# Patient Record
Sex: Female | Born: 1990 | Race: White | Hispanic: No | Marital: Single | Smoking: Never smoker
Health system: Southern US, Community
[De-identification: ages and names within clinical notes are randomized; demographics above are authoritative.]

## PROBLEM LIST (undated history)

## (undated) DIAGNOSIS — F32A Depression, unspecified: Secondary | ICD-10-CM

## (undated) DIAGNOSIS — F909 Attention-deficit hyperactivity disorder, unspecified type: Secondary | ICD-10-CM

## (undated) DIAGNOSIS — F419 Anxiety disorder, unspecified: Secondary | ICD-10-CM

## (undated) DIAGNOSIS — F329 Major depressive disorder, single episode, unspecified: Secondary | ICD-10-CM

## (undated) HISTORY — DX: Attention-deficit hyperactivity disorder, unspecified type: F90.9

## (undated) HISTORY — PX: WISDOM TOOTH EXTRACTION: SHX21

## (undated) HISTORY — DX: Major depressive disorder, single episode, unspecified: F32.9

## (undated) HISTORY — DX: Anxiety disorder, unspecified: F41.9

## (undated) HISTORY — DX: Depression, unspecified: F32.A

---
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
## 2015-05-05 DIAGNOSIS — F419 Anxiety disorder, unspecified: Secondary | ICD-10-CM | POA: Insufficient documentation

## 2015-05-05 DIAGNOSIS — F900 Attention-deficit hyperactivity disorder, predominantly inattentive type: Secondary | ICD-10-CM | POA: Insufficient documentation

## 2015-05-05 DIAGNOSIS — L700 Acne vulgaris: Secondary | ICD-10-CM | POA: Insufficient documentation

## 2015-11-24 ENCOUNTER — Other Ambulatory Visit: Payer: Self-pay | Admitting: Family Medicine

## 2015-11-25 LAB — CMP12+LP+TP+TSH+6AC+CBC/D/PLT
A/G RATIO: 1.9 (ref 1.2–2.2)
ALT: 7 IU/L (ref 0–32)
AST: 13 IU/L (ref 0–40)
Albumin: 4.6 g/dL (ref 3.5–5.5)
Alkaline Phosphatase: 39 IU/L (ref 39–117)
BASOS: 0 %
BUN/Creatinine Ratio: 17 (ref 9–23)
BUN: 11 mg/dL (ref 6–20)
Basophils Absolute: 0 10*3/uL (ref 0.0–0.2)
Bilirubin Total: 0.3 mg/dL (ref 0.0–1.2)
CALCIUM: 9 mg/dL (ref 8.7–10.2)
CHLORIDE: 100 mmol/L (ref 96–106)
CHOL/HDL RATIO: 2.5 ratio (ref 0.0–4.4)
CREATININE: 0.65 mg/dL (ref 0.57–1.00)
Cholesterol, Total: 147 mg/dL (ref 100–199)
EOS (ABSOLUTE): 0.3 10*3/uL (ref 0.0–0.4)
Eos: 6 %
Free Thyroxine Index: 1.7 (ref 1.2–4.9)
GFR calc non Af Amer: 126 mL/min/{1.73_m2} (ref >59)
GFR, EST AFRICAN AMERICAN: 145 mL/min/{1.73_m2} (ref >59)
GGT: 9 IU/L (ref 0–60)
GLUCOSE: 83 mg/dL (ref 65–99)
Globulin, Total: 2.4 g/dL (ref 1.5–4.5)
HDL: 59 mg/dL (ref >39)
HEMOGLOBIN: 12.4 g/dL (ref 11.1–15.9)
Hematocrit: 37.7 % (ref 34.0–46.6)
IMMATURE GRANS (ABS): 0 10*3/uL (ref 0.0–0.1)
Immature Granulocytes: 0 %
Iron: 75 ug/dL (ref 27–159)
LDH: 184 IU/L (ref 119–226)
LDL Calculated: 78 mg/dL (ref 0–99)
LYMPHS: 40 %
Lymphocytes Absolute: 1.9 10*3/uL (ref 0.7–3.1)
MCH: 29.9 pg (ref 26.6–33.0)
MCHC: 32.9 g/dL (ref 31.5–35.7)
MCV: 91 fL (ref 79–97)
MONOCYTES: 7 %
Monocytes Absolute: 0.3 10*3/uL (ref 0.1–0.9)
NEUTROS ABS: 2.3 10*3/uL (ref 1.4–7.0)
Neutrophils: 47 %
PHOSPHORUS: 3.2 mg/dL (ref 2.5–4.5)
POTASSIUM: 4 mmol/L (ref 3.5–5.2)
Platelets: 290 10*3/uL (ref 150–379)
RBC: 4.15 x10E6/uL (ref 3.77–5.28)
RDW: 12.8 % (ref 12.3–15.4)
Sodium: 137 mmol/L (ref 134–144)
T3 Uptake Ratio: 25 % (ref 24–39)
T4 TOTAL: 6.7 ug/dL (ref 4.5–12.0)
TSH: 1.94 u[IU]/mL (ref 0.450–4.500)
Total Protein: 7 g/dL (ref 6.0–8.5)
Triglycerides: 48 mg/dL (ref 0–149)
URIC ACID: 4 mg/dL (ref 2.5–7.1)
VLDL Cholesterol Cal: 10 mg/dL (ref 5–40)
WBC: 4.9 10*3/uL (ref 3.4–10.8)

## 2015-11-25 LAB — HGB A1C W/O EAG: Hgb A1c MFr Bld: 4.9 % (ref 4.8–5.6)

## 2016-05-20 ENCOUNTER — Encounter: Payer: Self-pay | Admitting: Registered Nurse

## 2016-05-20 ENCOUNTER — Ambulatory Visit: Payer: Self-pay | Admitting: Registered Nurse

## 2016-05-20 VITALS — BP 112/76 | HR 94 | Temp 98.7°F

## 2016-05-20 DIAGNOSIS — H6593 Unspecified nonsuppurative otitis media, bilateral: Secondary | ICD-10-CM

## 2016-05-20 DIAGNOSIS — J019 Acute sinusitis, unspecified: Secondary | ICD-10-CM

## 2016-05-20 MED ORDER — FEXOFENADINE HCL 180 MG PO TABS: 180.0000 mg | ORAL_TABLET | Freq: Every day | ORAL | 0 refills | Status: DC

## 2016-05-20 NOTE — Progress Notes (Signed)
Subjective:    Patient ID: Tracey Gibson, female    DOB: [birth year], 26 y.o.   MRN: [id number]  25y/o single caucasian female  with sore throat x2 days. Painful swallowing. Unsure if post nasal drainage. Denies runny nose, itchy watery eyes. +ear discomfort, fullness, muffled sounds intermittantly bilaterally.  Denied ear discharge/ bleeding/sick contacts/fever/chills.  PMHx seasonal allergic rhinitis has used flonase and allegra in the past.  Ran out of flonase.  Still has allegra at home.      Review of Systems  Constitutional: Negative for activity change, appetite change, chills, diaphoresis, fatigue, fever and unexpected weight change.  HENT: Positive for ear pain, hearing loss, postnasal drip, sinus pain, sinus pressure and sore throat. Negative for congestion, dental problem, drooling, ear discharge, facial swelling, mouth sores, nosebleeds, rhinorrhea, sneezing, tinnitus, trouble swallowing and voice change.   Eyes: Negative for photophobia, pain, discharge, redness, itching and visual disturbance.  Respiratory: Negative for cough, choking, chest tightness, shortness of breath, wheezing and stridor.   Cardiovascular: Negative for chest pain, palpitations and leg swelling.  Gastrointestinal: Negative for abdominal distention, abdominal pain, blood in stool, constipation, diarrhea, nausea and vomiting.  Endocrine: Negative for cold intolerance and heat intolerance.  Genitourinary: Negative for difficulty urinating, dysuria and hematuria.  Musculoskeletal: Negative for arthralgias, back pain, gait problem, joint swelling, myalgias, neck pain and neck stiffness.  Skin: Negative for color change, pallor, rash and wound.  Allergic/Immunologic: Positive for environmental allergies. Negative for food allergies.  Neurological: Negative for dizziness, tremors, seizures, syncope, facial asymmetry, speech difficulty, weakness, light-headedness, numbness and headaches.  Hematological: Negative for  adenopathy. Does not bruise/bleed easily.  Psychiatric/Behavioral: Negative for agitation, behavioral problems, confusion and sleep disturbance.       Objective:   Physical Exam  Constitutional: She is oriented to person, place, and time. Vital signs are normal. She appears well-developed and well-nourished. She is active and cooperative.  Non-toxic appearance. She does not have a sickly appearance. She does not appear ill. No distress.  HENT:  Head: Normocephalic and atraumatic.  Right Ear: Hearing, external ear and ear canal normal. A middle ear effusion is present.  Left Ear: Hearing, external ear and ear canal normal. A middle ear effusion is present.  Nose: Mucosal edema and rhinorrhea present. No nose lacerations, sinus tenderness, nasal deformity, septal deviation or nasal septal hematoma. No epistaxis.  No foreign bodies. Right sinus exhibits no maxillary sinus tenderness and no frontal sinus tenderness. Left sinus exhibits no maxillary sinus tenderness and no frontal sinus tenderness.  Mouth/Throat: Uvula is midline and mucous membranes are normal. Mucous membranes are not pale, not dry and not cyanotic. She does not have dentures. No oral lesions. No trismus in the jaw. Normal dentition. No dental abscesses, uvula swelling, lacerations or dental caries. Posterior oropharyngeal edema and posterior oropharyngeal erythema present. No oropharyngeal exudate or tonsillar abscesses.  Bilateral TMs air fluid level clear; bilateral nasal turbinates edema/erythema clear discharge; cobblestoning posterior pharynx; bilateral allergic shiners  Eyes: Conjunctivae, EOM and lids are normal. Pupils are equal, round, and reactive to light. Right eye exhibits no chemosis, no discharge, no exudate and no hordeolum. No foreign body present in the right eye. Left eye exhibits no chemosis, no discharge, no exudate and no hordeolum. No foreign body present in the left eye. Right conjunctiva is not injected. Right  conjunctiva has no hemorrhage. Left conjunctiva is not injected. Left conjunctiva has no hemorrhage. No scleral icterus. Right eye exhibits normal extraocular motion and no nystagmus. Left  eye exhibits normal extraocular motion and no nystagmus. Right pupil is round and reactive. Left pupil is round and reactive. Pupils are equal.  Neck: Trachea normal and normal range of motion. Neck supple. No tracheal tenderness, no spinous process tenderness and no muscular tenderness present. No neck rigidity. No tracheal deviation, no edema, no erythema and normal range of motion present. No thyroid mass and no thyromegaly present.  Cardiovascular: Normal rate, regular rhythm, S1 normal, S2 normal, normal heart sounds and intact distal pulses.  PMI is not displaced.  Exam reveals no gallop and no friction rub.   No murmur heard. Pulmonary/Chest: Effort normal and breath sounds normal. No accessory muscle usage or stridor. No respiratory distress. She has no decreased breath sounds. She has no wheezes. She has no rhonchi. She has no rales. She exhibits no tenderness.  Speaks full sentences without difficulty no cough observed in exam room  Abdominal: Soft. Normal appearance. She exhibits no distension.  Musculoskeletal: Normal range of motion. She exhibits no edema or tenderness.       Right shoulder: Normal.       Left shoulder: Normal.       Right hip: Normal.       Left hip: Normal.       Right knee: Normal.       Left knee: Normal.       Cervical back: Normal.       Right hand: Normal.       Left hand: Normal.  Lymphadenopathy:       Head (right side): No submental, no submandibular, no tonsillar, no preauricular, no posterior auricular and no occipital adenopathy present.       Head (left side): No submental, no submandibular, no tonsillar, no preauricular, no posterior auricular and no occipital adenopathy present.    She has no cervical adenopathy.       Right cervical: No superficial cervical, no  deep cervical and no posterior cervical adenopathy present.      Left cervical: No superficial cervical, no deep cervical and no posterior cervical adenopathy present.  Neurological: She is alert and oriented to person, place, and time. She has normal strength. She is not disoriented. She displays no atrophy and no tremor. No cranial nerve deficit or sensory deficit. She exhibits normal muscle tone. She displays no seizure activity. Coordination and gait normal. GCS eye subscore is 4. GCS verbal subscore is 5. GCS motor subscore is 6.  Skin: Skin is warm, dry and intact. No abrasion, no bruising, no burn, no ecchymosis, no laceration, no lesion, no petechiae and no rash noted. She is not diaphoretic. No cyanosis or erythema. No pallor. Nails show no clubbing.  Psychiatric: She has a normal mood and affect. Her speech is normal and behavior is normal. Judgment and thought content normal. Cognition and memory are normal.  Nursing note and vitals reviewed.         Assessment & Plan:  A-acute rhinosinusitis; acute otitis media effusion; acute seasonal allergic rhinitis pollen  P-Restart flonase 1 spray each nostril BID, saline 2 sprays each nostril q2h prn congestion.  If no improvement with 48 hours of saline, allegra and flonase use follow up for re-evaluation consider oral antibiotics or steroids.  Rx given.  No evidence of systemic bacterial infection, non toxic and well hydrated.  I do not see where any further testing or imaging is necessary at this time.   I will suggest supportive care, rest, good hygiene and encourage the patient to  take adequate fluids.  The patient is to return to clinic or EMERGENCY ROOM if symptoms worsen or change significantly.  Exitcare handout on sinusitis given to patient.  Patient verbalized agreement and understanding of treatment plan and had no further questions at this time.   P2:  Hand washing and cover cough  Supportive treatment.   No evidence of invasive  bacterial infection, non toxic and well hydrated.  I do not see where any further testing or imaging is necessary at this time.   I will suggest supportive care, rest, good hygiene and encourage the patient to take adequate fluids.  The patient is to return to clinic or EMERGENCY ROOM if symptoms worsen or change significantly e.g. ear pain, fever, purulent discharge from ears or bleeding.  Exitcare handout on otitis media with effusion given to patient.  Patient verbalized agreement and understanding of treatment plan.    Restart flonase 1 spray each nostril BID #1 RF0 dispensed from [facility].  Given 1 UD bottle nasal saline from clinic stock.  Has allegra at home and electronic Rx for refill allegra 180mg  po daily #90 RF0 sent to pharmacy of patient choice so she can use flex spending card.  Patient may use normal saline nasal spray as needed.  Consider antihistamine or nasal steroid use.  Avoid triggers if possible.  Shower prior to bedtime if exposed to triggers.  If allergic dust/dust mites recommend mattress/pillow covers/encasements; washing linens, vacuuming, sweeping, dusting weekly.  Call or return to clinic as needed if these symptoms worsen or fail to improve as anticipated.   Exitcare handout on allergic rhinitis given to patient.  Patient verbalized understanding of instructions, agreed with plan of care and had no further questions at this time.  P2:  Avoidance and hand washing.  Usually no specific medical treatment is needed if a virus is causing the sore throat.  The throat most often gets better on its own within 5 to 7 days.  Antibiotic medicine does not cure viral pharyngitis.   For acute pharyngitis caused by bacteria, your healthcare provider will prescribe an antibiotic.  Marland Kitchen Do not smoke.  Marland Kitchen Avoid secondhand smoke and other air pollutants.  . Use a cool mist humidifier to add moisture to the air.  . Get plenty of rest.  . You may want to rest your throat by talking less and eating a  diet that is mostly liquid or soft for a day or two.   Marland Kitchen Nonprescription throat lozenges and mouthwashes should help relieve the soreness.   . Gargling with warm saltwater and drinking warm liquids may help.  (You can make a saltwater solution by adding 1/4 teaspoon of salt to 8 ounces, or 240 mL, of warm water.)  . A nonprescription pain reliever such as aspirin, acetaminophen, or ibuprofen may ease general aches and pains.   FOLLOW UP with clinic provider if no improvements in the next 7-10 days.  Patient verbalized understanding of instructions and agreed with plan of care.

## 2016-05-20 NOTE — Patient Instructions (Signed)
Restart flonase 1 spray each nostril twice a day Nasal saline 2 sprays each nostril every 2 hours while awake as needed for congestion Aggressive nasal spray use in the shower twice a day Shower twice a day Restart allegra 180mg  by mouth daily at home (refill sent in to pharmacy for you to use flex spending account) If worsening follow up for re-evaluation especially fever, ear discharge, daily headache, teeth pain, worsening ear pain   Otitis Media With Effusion, Pediatric Otitis media with effusion (OME) occurs when there is inflammation of the middle ear and fluid in the middle ear space. There are no signs and symptoms of infection. The middle ear space contains air and the bones for hearing. Air in the middle ear space helps to transmit sound to the brain. OME is a common condition in children, and it often occurs after an ear infection. This condition may be present for several weeks or longer after an ear infection. Most cases of this condition get better on their own. What are the causes? OME is caused by a blockage of the eustachian tube in one or both ears. These tubes drain fluid in the ears to the back of the nose (nasopharynx). If the tissue in the tube swells up (edema), the tube closes. This prevents fluid from draining. Blockage can be caused by:  Ear infections.  Colds and other upper respiratory infections.  Allergies.  Irritants, such as tobacco smoke.  Enlarged adenoids. The adenoids are areas of soft tissue located high in the back of the throat, behind the nose and the roof of the mouth. They are part of the body's natural defense (immune) system.  A mass in the nasopharynx.  Damage to the ear caused by pressure changes (barotrauma). What increases the risk? Your child is more likely to develop this condition if:  He or she has repeated ear and sinus infections.  He or she has allergies.  He or she is exposed to tobacco smoke.  He or she attends  daycare.  He or she is not breastfed. What are the signs or symptoms? Symptoms of this condition may not be obvious. Sometimes this condition does not have any symptoms, or symptoms may overlap with those of a cold or upper respiratory tract illness. Symptoms of this condition include:  Temporary hearing loss.  A feeling of fullness in the ear without pain.  Irritability or agitation.  Balance (vestibular) problems. As a result of hearing loss, your child may:  Listen to the TV at a loud volume.  Not respond to questions.  Ask "What?" often when spoken to.  Mistake or confuse one sound or word for another.  Perform poorly at school.  Have a poor attention span.  Become agitated or irritated easily. How is this diagnosed? This condition is diagnosed with an ear exam. Your child's health care provider will look inside your child's ear with an instrument (otoscope) to check for redness, swelling, and fluid. Other tests may be done, including:  A test to check the movement of the eardrum (pneumatic otoscopy). This is done by squeezing a small amount of air into the ear.  A test that changes air pressure in the middle ear to check how well the eardrum moves and to see if the eustachian tube is working (tympanogram).  Hearing test (audiogram). This test involves playing tones at different pitches to see if your child can hear each tone. How is this treated? Treatment for this condition depends on the cause. In  many cases, the fluid goes away on its own. In some cases, your child may need a procedure to create a hole in the eardrum to allow fluid to drain (myringotomy) and to insert small drainage tubes (tympanostomy tubes) into the eardrums. These tubes help to drain fluid and prevent infection. This procedure may be recommended if:  OME does not get better over several months.  Your child has many ear infections within several months.  Your child has noticeable hearing  loss.  Your child has problems with speech and language development. Surgery may also be done to remove the adenoids (adenoidectomy). Follow these instructions at home:  Give over-the-counter and prescription medicines only as told by your child's health care provider.  Keep children away from any tobacco smoke.  Keep all follow-up visits as told by your child's health care provider. This is important. How is this prevented?  Keep your child's vaccinations up to date. Make sure your child gets all recommended vaccinations, including a pneumonia and flu vaccine.  Encourage hand washing. Your child should wash his or her hands often with soap and water. If there is no soap and water, he or she should use hand sanitizer.  Avoid exposing your child to tobacco smoke.  Breastfeed your baby, if possible. Babies who are breastfed as long as possible are less likely to develop this condition. Contact a health care provider if:  Your child's hearing does not get better after 3 months.  Your child's hearing is worse.  Your child has ear pain.  Your child has a fever.  Your child has drainage from the ear.  Your child is dizzy.  Your child has a lump on his or her neck. Get help right away if:  Your child has bleeding from the nose.  Your child cannot move part of her or his face.  Your child has trouble breathing.  Your child cannot smell.  Your child develops severe congestion.  Your child develops weakness.  Your child who is younger than 3 months has a temperature of 100F (38C) or higher. Summary  Otitis media with effusion (OME) occurs when there is inflammation of the middle ear and fluid in the middle ear space.  This condition is caused by blockage of one or both eustachian tubes, which drain fluid in the ears to the back of the nose.  Symptoms of this condition can include temporary hearing loss, a feeling of fullness in the ear, irritability or agitation, and  balance (vestibular) problems. Sometimes, there are no symptoms.  This condition is diagnosed with an ear exam and tests, such as pneumatic otoscopy, tympanogram, and audiogram.  Treatment for this condition depends on the cause. In many cases, the fluid goes away on its own. This information is not intended to replace advice given to you by your health care provider. Make sure you discuss any questions you have with your health care provider. Document Released: 05/08/2003 Document Revised: 01/08/2016 Document Reviewed: 01/08/2016 Elsevier Interactive Patient Education  2017 [employer]  Sinusitis, Adult Sinusitis is soreness and inflammation of your sinuses. Sinuses are hollow spaces in the bones around your face. They are located:  Around your eyes.  In the middle of your forehead.  Behind your nose.  In your cheekbones. Your sinuses and nasal passages are lined with a stringy fluid (mucus). Mucus normally drains out of your sinuses. When your nasal tissues get inflamed or swollen, the mucus can get trapped or blocked so air cannot flow through your  sinuses. This lets bacteria, viruses, and funguses grow, and that leads to infection. Follow these instructions at home: Medicines   Take, use, or apply over-the-counter and prescription medicines only as told by your doctor. These may include nasal sprays.  If you were prescribed an antibiotic medicine, take it as told by your doctor. Do not stop taking the antibiotic even if you start to feel better. Hydrate and Humidify   Drink enough water to keep your pee (urine) clear or pale yellow.  Use a cool mist humidifier to keep the humidity level in your home above 50%.  Breathe in steam for 10-15 minutes, 3-4 times a day or as told by your doctor. You can do this in the bathroom while a hot shower is running.  Try not to spend time in cool or dry air. Rest   Rest as much as possible.  Sleep with your head raised  (elevated).  Make sure to get enough sleep each night. General instructions   Put a warm, moist washcloth on your face 3-4 times a day or as told by your doctor. This will help with discomfort.  Wash your hands often with soap and water. If there is no soap and water, use hand sanitizer.  Do not smoke. Avoid being around people who are smoking (secondhand smoke).  Keep all follow-up visits as told by your doctor. This is important. Contact a doctor if:  You have a fever.  Your symptoms get worse.  Your symptoms do not get better within 10 days. Get help right away if:  You have a very bad headache.  You cannot stop throwing up (vomiting).  You have pain or swelling around your face or eyes.  You have trouble seeing.  You feel confused.  Your neck is stiff.  You have trouble breathing. This information is not intended to replace advice given to you by your health care provider. Make sure you discuss any questions you have with your health care provider. Document Released: 08/04/2007 Document Revised: 10/12/2015 Document Reviewed: 12/11/2014 Elsevier Interactive Patient Education  2017 [employer] Allergic Rhinitis Allergic rhinitis is when the mucous membranes in the nose respond to allergens. Allergens are particles in the air that cause your body to have an allergic reaction. This causes you to release allergic antibodies. Through a chain of events, these eventually cause you to release histamine into the blood stream. Although meant to protect the body, it is this release of histamine that causes your discomfort, such as frequent sneezing, congestion, and an itchy, runny nose. What are the causes? Seasonal allergic rhinitis (hay fever) is caused by pollen allergens that may come from grasses, trees, and weeds. Year-round allergic rhinitis (perennial allergic rhinitis) is caused by allergens such as house dust mites, pet dander, and mold spores. What are the signs or  symptoms?  Nasal stuffiness (congestion).  Itchy, runny nose with sneezing and tearing of the eyes. How is this diagnosed? Your health care provider can help you determine the allergen or allergens that trigger your symptoms. If you and your health care provider are unable to determine the allergen, skin or blood testing may be used. Your health care provider will diagnose your condition after taking your health history and performing a physical exam. Your health care provider may assess you for other related conditions, such as asthma, pink eye, or an ear infection. How is this treated? Allergic rhinitis does not have a cure, but it can be controlled by:  Medicines that block  allergy symptoms. These may include allergy shots, nasal sprays, and oral antihistamines.  Avoiding the allergen. Hay fever may often be treated with antihistamines in pill or nasal spray forms. Antihistamines block the effects of histamine. There are over-the-counter medicines that may help with nasal congestion and swelling around the eyes. Check with your health care provider before taking or giving this medicine. If avoiding the allergen or the medicine prescribed do not work, there are many new medicines your health care provider can prescribe. Stronger medicine may be used if initial measures are ineffective. Desensitizing injections can be used if medicine and avoidance does not work. Desensitization is when a patient is given ongoing shots until the body becomes less sensitive to the allergen. Make sure you follow up with your health care provider if problems continue. Follow these instructions at home: It is not possible to completely avoid allergens, but you can reduce your symptoms by taking steps to limit your exposure to them. It helps to know exactly what you are allergic to so that you can avoid your specific triggers. Contact a health care provider if:  You have a fever.  You develop a cough that does not  stop easily (persistent).  You have shortness of breath.  You start wheezing.  Symptoms interfere with normal daily activities. This information is not intended to replace advice given to you by your health care provider. Make sure you discuss any questions you have with your health care provider. Document Released: 11/10/2000 Document Revised: 10/17/2015 Document Reviewed: 10/23/2012 Elsevier Interactive Patient Education  2017 [employer].

## 2016-08-19 ENCOUNTER — Ambulatory Visit: Payer: Self-pay | Admitting: *Deleted

## 2016-08-19 VITALS — BP 100/77 | HR 84 | Ht 67.5 in | Wt 159.0 lb

## 2016-08-19 DIAGNOSIS — Z0000 Encounter for general adult medical examination without abnormal findings: Secondary | ICD-10-CM

## 2016-08-19 NOTE — Progress Notes (Signed)
[employer] insurance premium discount evaluation: Labs Drawn. Replacements ROI form signed. Tobacco Free Attestation form signed.  Forms placed in paper chart.  

## 2016-08-20 LAB — CMP12+LP+TP+TSH+6AC+CBC/D/PLT
ALT: 9 IU/L (ref 0–32)
AST: 15 IU/L (ref 0–40)
Albumin/Globulin Ratio: 2.2 (ref 1.2–2.2)
Albumin: 4.8 g/dL (ref 3.5–5.5)
Alkaline Phosphatase: 35 IU/L — ABNORMAL LOW (ref 39–117)
BUN / CREAT RATIO: 10 (ref 9–23)
BUN: 7 mg/dL (ref 6–20)
Basophils Absolute: 0 10*3/uL (ref 0.0–0.2)
Basos: 0 %
Bilirubin Total: 0.3 mg/dL (ref 0.0–1.2)
CALCIUM: 9.2 mg/dL (ref 8.7–10.2)
CHLORIDE: 100 mmol/L (ref 96–106)
CHOL/HDL RATIO: 2.3 ratio (ref 0.0–4.4)
CREATININE: 0.73 mg/dL (ref 0.57–1.00)
Cholesterol, Total: 145 mg/dL (ref 100–199)
EOS (ABSOLUTE): 0.4 10*3/uL (ref 0.0–0.4)
EOS: 6 %
Free Thyroxine Index: 2 (ref 1.2–4.9)
GFR, EST AFRICAN AMERICAN: 133 mL/min/{1.73_m2} (ref >59)
GFR, EST NON AFRICAN AMERICAN: 116 mL/min/{1.73_m2} (ref >59)
GGT: 8 IU/L (ref 0–60)
GLUCOSE: 79 mg/dL (ref 65–99)
Globulin, Total: 2.2 g/dL (ref 1.5–4.5)
HDL: 63 mg/dL (ref >39)
HEMOGLOBIN: 12.5 g/dL (ref 11.1–15.9)
Hematocrit: 37.7 % (ref 34.0–46.6)
IRON: 100 ug/dL (ref 27–159)
Immature Grans (Abs): 0 10*3/uL (ref 0.0–0.1)
Immature Granulocytes: 0 %
LDH: 158 IU/L (ref 119–226)
LDL Calculated: 70 mg/dL (ref 0–99)
LYMPHS ABS: 1.9 10*3/uL (ref 0.7–3.1)
Lymphs: 32 %
MCH: 30.6 pg (ref 26.6–33.0)
MCHC: 33.2 g/dL (ref 31.5–35.7)
MCV: 92 fL (ref 79–97)
MONOCYTES: 8 %
Monocytes Absolute: 0.5 10*3/uL (ref 0.1–0.9)
NEUTROS ABS: 3.1 10*3/uL (ref 1.4–7.0)
Neutrophils: 54 %
PHOSPHORUS: 2.9 mg/dL (ref 2.5–4.5)
PLATELETS: 253 10*3/uL (ref 150–379)
POTASSIUM: 4.1 mmol/L (ref 3.5–5.2)
RBC: 4.08 x10E6/uL (ref 3.77–5.28)
RDW: 13.1 % (ref 12.3–15.4)
SODIUM: 137 mmol/L (ref 134–144)
T3 UPTAKE RATIO: 26 % (ref 24–39)
T4 TOTAL: 7.7 ug/dL (ref 4.5–12.0)
TOTAL PROTEIN: 7 g/dL (ref 6.0–8.5)
TSH: 1.86 u[IU]/mL (ref 0.450–4.500)
Triglycerides: 58 mg/dL (ref 0–149)
URIC ACID: 4.3 mg/dL (ref 2.5–7.1)
VLDL Cholesterol Cal: 12 mg/dL (ref 5–40)
WBC: 5.8 10*3/uL (ref 3.4–10.8)

## 2016-08-20 LAB — HGB A1C W/O EAG: HEMOGLOBIN A1C: 4.9 % (ref 4.8–5.6)

## 2016-08-23 NOTE — Progress Notes (Signed)
Results reviewed 08/20/16. Labs unremarkable. Discussed recommended amounts of exercise and fiber. Copy provided to pt. No pcp to route to.

## 2016-11-11 ENCOUNTER — Ambulatory Visit: Payer: Self-pay | Admitting: Registered Nurse

## 2016-11-11 VITALS — BP 114/71 | HR 83 | Temp 97.8°F

## 2016-11-11 DIAGNOSIS — R251 Tremor, unspecified: Secondary | ICD-10-CM

## 2016-11-11 DIAGNOSIS — M25832 Other specified joint disorders, left wrist: Secondary | ICD-10-CM

## 2016-11-11 NOTE — Patient Instructions (Signed)
Ulnar Nerve Contusion Your ulnar nerve extends from your shoulder to the "pinkie side" of your hand. This nerve provides feeling to the pinkie and half of the ring finger. It also controls many hand and forearm muscles that let you grip objects. An ulnar nerve contusion is a bruise of the ulnar nerve. An ulnar nerve contusion can cause a loss of feeling or movement in your hand. It can also affect your ability to use the muscles that you use to grip objects. What are the causes? This condition may be caused by:  A hit to the elbow.  Falling on your elbow.  Shoving your elbow against a hard surface.  What increases the risk? This condition is more likely to develop in people who:  Play contact sports, like football.  Have a disorder that increases the risk of bleeding.  Take blood thinning medicine, such as warfarin.  What are the signs or symptoms? Symptoms of this condition include:  Tingling or numbness in the hand, especially in the pinkie or ring finger.  Weakness while moving the hand from side to side in a waving motion, or while moving your fingers together.  Abnormal claw-like hand position.  How is this diagnosed? This condition may be diagnosed based on your symptoms, a medical history, and a physical exam. You may have tests, such as:  An X-ray. This may be done to check for broken bones.  An electromyogram (EMG). This is done to see how well your nerves are working.  A nerve conduction study. This is done to see how electrical signals pass through your nerves.  An MRI. This is done to find the source of any nerve problems.  How is this treated? This condition usually heals on its own within 6 weeks. Treatment can help to reduce symptoms and it may include:  Wearing a brace or splint at night keep your elbow straight while you sleep.  Taking anti-inflammatory medicine, such as ibuprofen, to reduce pain and swelling around the nerve.  Working with a physical  therapist to ease stiffness in your arm and wrist.  Follow these instructions at home: If you have a brace or splint:  Wear it as told by your health care provider. Remove it only as told by your health care provider.  Loosen the brace or splint if your fingers tingle, become numb, or turn cold and blue.  Keep the brace or splint clean.  If your brace or splint is not waterproof: ? Do not let it get wet. ? Protect it with a watertight covering when you take a bath or a shower. Managing pain, stiffness, and swelling  Take over-the-counter and prescription medicines only as told by your health care provider.  If directed, apply ice to the injured area. ? Put ice in a plastic bag. ? Place a towel between your skin and the bag. ? Leave the ice on for 20 minutes, 2-3 times a day. Activity  Return to your normal activities as told by your health care provider. Ask your health care provider what activities are safe for you.  Avoid activities that take a lot of effort (are strenuous) for as long as told by your health care provider.  Do exercises as told by your health care provider. General instructions  Do not use the injured limb to support your body weight until your health care provider says that you can.  Do not use any tobacco products, including cigarettes, chewing tobacco, or e-cigarettes. Tobacco can delay bone  healing. If you need help quitting, ask your health care provider.  Keep all follow-up visits as told by your health care provider. This is important. How is this prevented?  Warm up and stretch before being active.  Cool down and stretch after being active.  Give your body time to rest between periods of activity.  Make sure to use equipment that fits you.  Be safe and responsible while being active to avoid falls.  Do at least 150 minutes of moderate-intensity exercise each week, such as brisk walking or water aerobics.  Maintain physical fitness,  including: ? Strength. ? Flexibility. ? Cardiovascular fitness. ? Endurance. Contact a health care provider if:  Your pain does not improve or it gets worse.  Your swelling does not improve or it gets worse.  Your grip becomes weaker. Get help right away if:  You have severe pain.  You have severe swelling.  You cannot move your wrist, hand, or elbow.  You cannot feel parts of your hand, wrist, or arm. This information is not intended to replace advice given to you by your health care provider. Make sure you discuss any questions you have with your health care provider. Document Released: 02/15/2005 Document Revised: 10/22/2015 Document Reviewed: 12/26/2014 Elsevier Interactive Patient Education  2018 [employer] Ulnar Nerve Contusion Rehab Ask your health care provider which exercises are safe for you. Do exercises exactly as told by your health care provider and adjust them as directed. It is normal to feel mild stretching, pulling, tightness, or discomfort as you do these exercises, but you should stop right away if you feel sudden pain or your pain gets worse. Do not begin these exercises until told by your health care provider. Stretching and range of motion exercises These exercises warm up your muscles and joints and improve the movement and flexibility of your forearm. These exercises also help to relieve pain, numbness, and tingling. Exercise A: Extensor stretch 1. Stand over a tabletop with your __________ hand resting palm-up on the tabletop and your fingers pointing away from your body. Your arm should be extended and there should be a slight bend in your elbow. 2. Gently press the back of your hand down onto the table by straightening your elbow. You should feel a stretch on the top of your forearm. 3. Hold this position for __________ seconds. 4. Slowly return to the starting position. Repeat __________ times. Complete this stretch __________ times a day. Exercise  B: Flexor stretch  1. Stand over a tabletop with your __________ hand resting palm-down on the tabletop and your fingers pointing away from your body. Your arm should be extended and there should be a slight bend in your elbow. 2. Gently press your fingers and palm down onto the table by straightening your elbow. You should feel a stretch on the inside of your forearm. 3. Hold this position for __________ seconds. 4. Slowly return to the starting position. Repeat __________ times. Complete this stretch __________ times a day. Exercise C: Ulnar nerve glide, hand moving 1. Stand or sit with your left / right elbow bent and your hand at the height of your shoulder. 2. Move your elbow about 6 inches (15 cm) away from your body. 3. Gently and slowly wave your hand back and forth. 4. Repeat this motion for __________ seconds. Repeat __________ times. Complete this exercise __________ times a day. Exercise D: Ulnar nerve glide, elbow moving 1. Stand or sit with your left / right arm straight out  to your side at shoulder height. 2. Bring your fingers up toward the ceiling so your palm faces away from you. 3. Bend your elbow and bring it to your side and bend your wrist so your palm now faces the floor. 4. Slowly go back and forth between doing the step 2 position and the step 3 position. 5. Repeat these motions for __________ seconds. Repeat __________ times. Complete this exercise __________ times a day. Strengthening exercises These exercises build strength and endurance in your forearm. Endurance is the ability to use your muscles for a long time, even after they get tired. Exercise E: Grip  1. Hold one of these items in your __________ hand: a dense sponge, a tennis ball, or a large, rolled sock. 2. Slowly squeeze as hard as you can without increasing any pain. 3. Hold this position for __________ seconds. 4. Slowly release your grip. Repeat __________ times. Complete this exercise __________  times a day. This information is not intended to replace advice given to you by your health care provider. Make sure you discuss any questions you have with your health care provider. Document Released: 02/15/2005 Document Revised: 10/23/2015 Document Reviewed: 12/26/2014 Elsevier Interactive Patient Education  2018 [employer]  Tremor A tremor is trembling or shaking that you cannot control. Most tremors affect the hands or arms. Tremors can also affect the head, vocal cords, face, and other parts of the body. There are many types of tremors. Common types include:  Essential tremor. These usually occur in people over the age of 24. It may run in families and can happen in otherwise healthy people.  Resting tremor. These occur when the muscles are at rest, such as when your hands are resting in your lap. People with Parkinson disease often have resting tremors.  Postural tremor. These occur when you try to hold a pose, such as keeping your hands outstretched.  Kinetic tremor. These occur during purposeful movement, such as trying to touch a finger to your nose.  Task-specific tremor. These may occur when you perform tasks such as handwriting, speaking, or standing.  Psychogenic tremor. These dramatically lessen or disappear when you are distracted. They can happen in people of all ages.  Some types of tremors have no known cause. Tremors can also be a symptom of nervous system problems (neurological disorders) that may occur with aging. Some tremors go away with treatment while others do not. Follow these instructions at home: Watch your tremor for any changes. The following actions may help to lessen any discomfort you are feeling:  Take medicines only as directed by your health care provider.  Limit alcohol intake to no more than 1 drink per day for nonpregnant women and 2 drinks per day for men. One drink equals 12 oz of beer, 5 oz of wine, or 1 oz of hard liquor.  Do not use any  tobacco products, including cigarettes, chewing tobacco, or electronic cigarettes. If you need help quitting, ask your health care provider.  Avoid extreme heat or cold.  Limit the amount of caffeine you consumeas directed by your health care provider.  Try to get 8 hours of sleep each night.  Find ways to manage your stress, such as meditation or yoga.  Keep all follow-up visits as directed by your health care provider. This is important.  Contact a health care provider if:  You start having a tremor after starting a new medicine.  You have tremor with other symptoms such as: ? Numbness. ?  Tingling. ? Pain. ? Weakness.  Your tremor gets worse.  Your tremor interferes with your day-to-day life. This information is not intended to replace advice given to you by your health care provider. Make sure you discuss any questions you have with your health care provider. Document Released: 02/05/2002 Document Revised: 10/19/2015 Document Reviewed: 08/13/2013 Elsevier Interactive Patient Education  2018 [employer].  Cervical Radiculopathy Cervical radiculopathy means that a nerve in the neck is pinched or bruised. This can cause pain or loss of feeling (numbness) that runs from your neck to your arm and fingers. Follow these instructions at home: Managing pain  Take over-the-counter and prescription medicines only as told by your doctor.  If directed, put ice on the injured or painful area. ? Put ice in a plastic bag. ? Place a towel between your skin and the bag. ? Leave the ice on for 20 minutes, 2-3 times per day.  If ice does not help, you can try using heat. Take a warm shower or warm bath, or use a heat pack as told by your doctor.  You may try a gentle neck and shoulder massage. Activity  Rest as needed. Follow instructions from your doctor about any activities to avoid.  Do exercises as told by your doctor or physical therapist. General instructions  If you were  given a soft collar, wear it as told by your doctor.  Use a flat pillow when you sleep.  Keep all follow-up visits as told by your doctor. This is important. Contact a doctor if:  Your condition does not improve with treatment. Get help right away if:  Your pain gets worse and is not controlled with medicine.  You lose feeling or feel weak in your hand, arm, face, or leg.  You have a fever.  You have a stiff neck.  You cannot control when you poop or pee (have incontinence).  You have trouble with walking, balance, or talking. This information is not intended to replace advice given to you by your health care provider. Make sure you discuss any questions you have with your health care provider. Document Released: 02/04/2011 Document Revised: 07/24/2015 Document Reviewed: 04/11/2014 Elsevier Interactive Patient Education  [employer].

## 2016-11-11 NOTE — Progress Notes (Signed)
Subjective:    Patient ID: Tracey Gibson, female    DOB: 1990-09-29, 26 y.o.   MRN: [id number]  26y/o singe caucasian female established Pt reports having an infected sebaceous cyst vs lipoma removed from posterior upper L arm 1 week ago. Was positioned prone on procedure table. During procedure had tingling in L elbow. Now with tingling down lateral length of L arm, worse in the L elbow and 4th and 5th digits. Worsens with increased arm use.  Having difficulty sleeping due to sensations/discomfort.  Stitches intact x 4 left deltoid denied fever/chills/nausea/vomiting/headache/discharge/weakness/dropping items/swelling/rash.  Right hand dominant  Has tried ice and heat and stretching without any relief.  Noticed tremors in hands today at work.  Denied these symptoms occurring in the past.  Still waiting for pathology report from dermatology told results in 7-10 days.  Has been changing bandage over sutures daily and washing area with soap and water in shower.      Review of Systems  Constitutional: Negative for activity change, appetite change, chills, diaphoresis, fatigue and fever.  HENT: Negative for trouble swallowing and voice change.   Eyes: Negative for photophobia and visual disturbance.  Respiratory: Negative for cough, shortness of breath, wheezing and stridor.   Cardiovascular: Negative for chest pain and leg swelling.  Gastrointestinal: Negative for blood in stool, diarrhea, nausea and vomiting.  Endocrine: Negative for cold intolerance and heat intolerance.  Genitourinary: Negative for difficulty urinating, dysuria and hematuria.  Musculoskeletal: Positive for myalgias. Negative for arthralgias, back pain, gait problem, joint swelling, neck pain and neck stiffness.  Skin: Positive for wound. Negative for color change, pallor and rash.  Allergic/Immunologic: Negative for environmental allergies and food allergies.  Neurological: Positive for tremors, weakness and numbness. Negative  for dizziness, seizures, syncope, facial asymmetry, speech difficulty, light-headedness and headaches.  Hematological: Negative for adenopathy. Does not bruise/bleed easily.  Psychiatric/Behavioral: Positive for sleep disturbance. Negative for agitation and confusion. The patient is not nervous/anxious.        Objective:   Physical Exam  Constitutional: She is oriented to person, place, and time. Vital signs are normal. She appears well-developed and well-nourished. She is active and cooperative.  Non-toxic appearance. She does not have a sickly appearance. She does not appear ill. No distress.  HENT:  Head: Normocephalic and atraumatic.  Right Ear: Hearing and external ear normal.  Left Ear: Hearing and external ear normal.  Nose: No mucosal edema, rhinorrhea, nose lacerations, nasal deformity, septal deviation or nasal septal hematoma. No epistaxis.  No foreign bodies.  Mouth/Throat: Uvula is midline and mucous membranes are normal. Mucous membranes are not pale, not dry and not cyanotic. She does not have dentures. No oral lesions. No trismus in the jaw. Normal dentition. No dental abscesses, uvula swelling, lacerations or dental caries. Posterior oropharyngeal edema and posterior oropharyngeal erythema present. No oropharyngeal exudate or tonsillar abscesses.  Cobblestoning posterior pharynx; bilateral allergic shiners  Eyes: Pupils are equal, round, and reactive to light. Conjunctivae, EOM and lids are normal. Right eye exhibits no chemosis, no discharge, no exudate and no hordeolum. No foreign body present in the right eye. Left eye exhibits no chemosis, no discharge, no exudate and no hordeolum. No foreign body present in the left eye. Right conjunctiva is not injected. Right conjunctiva has no hemorrhage. Left conjunctiva is not injected. Left conjunctiva has no hemorrhage. No scleral icterus. Right eye exhibits normal extraocular motion and no nystagmus. Left eye exhibits normal extraocular  motion and no nystagmus. Right pupil is  round and reactive. Left pupil is round and reactive. Pupils are equal.  Neck: Trachea normal, normal range of motion and phonation normal. Neck supple. Muscular tenderness present. No tracheal tenderness and no spinous process tenderness present. No neck rigidity. No tracheal deviation, no edema, no erythema and normal range of motion present. No thyroid mass and no thyromegaly present.    Bilateral trapezius tight and "pulling" sensation with AROM c-spine  Cardiovascular: Normal rate, regular rhythm, S1 normal, S2 normal, normal heart sounds and intact distal pulses.  PMI is not displaced.  Exam reveals no gallop and no friction rub.   No murmur heard. Pulses:      Radial pulses are 2+ on the right side, and 2+ on the left side.  Pulmonary/Chest: Effort normal and breath sounds normal. No accessory muscle usage or stridor. No respiratory distress. She has no decreased breath sounds. She has no wheezes. She has no rhonchi. She has no rales. She exhibits no tenderness.  Abdominal: She exhibits no distension.  Musculoskeletal: Normal range of motion. She exhibits no edema.       Right shoulder: Normal.       Left shoulder: Normal.       Right elbow: Normal.      Left elbow: Normal.       Right wrist: Normal.       Left wrist: Normal.       Right hip: Normal.       Left hip: Normal.       Right knee: Normal.       Left knee: Normal.       Cervical back: She exhibits tenderness, pain and spasm. She exhibits normal range of motion, no bony tenderness, no swelling, no edema, no deformity, no laceration and normal pulse.       Thoracic back: Normal.       Lumbar back: Normal.       Back:       Right upper arm: Normal.       Left upper arm: Normal.       Right forearm: Normal.       Left forearm: Normal.       Arms:      Right hand: Normal.       Left hand: Normal.  Lymphadenopathy:       Head (right side): No submental, no submandibular, no  tonsillar, no preauricular, no posterior auricular and no occipital adenopathy present.       Head (left side): No submental, no submandibular, no tonsillar, no preauricular, no posterior auricular and no occipital adenopathy present.    She has no cervical adenopathy.       Right cervical: No superficial cervical, no deep cervical and no posterior cervical adenopathy present.      Left cervical: No superficial cervical, no deep cervical and no posterior cervical adenopathy present.  Neurological: She is alert and oriented to person, place, and time. She has normal strength. She is not disoriented. She displays tremor. She displays no atrophy and normal reflexes. No cranial nerve deficit or sensory deficit. She exhibits normal muscle tone. She displays no seizure activity. Coordination and gait normal. GCS eye subscore is 4. GCS verbal subscore is 5. GCS motor subscore is 6.  Reflex Scores:      Brachioradialis reflexes are 2+ on the right side and 2+ on the left side.      Patellar reflexes are 2+ on the right side and 2+ on the left side.  Achilles reflexes are 2+ on the right side and 2+ on the left side. Bilateral hand grasp equal 5/5; left 3-5 digits 4+/5 strength opposition resistance; right all digits 5/5; on/off exam table without difficulty; in/out of chair without difficulty; gait sure and stable in hallway; bilateral hand tremors mild equal resolves with AROM most noticeable holding hands elevated in front of body  Skin: Skin is warm and dry. Laceration noted. No abrasion, no bruising, no burn, no ecchymosis, no lesion, no petechiae and no rash noted. She is not diaphoretic. No cyanosis or erythema. No pallor. Nails show no clubbing.     1cm 4 sutures intact left deltoid centrally dry warm pink wound edges well approximated  Psychiatric: She has a normal mood and affect. Her speech is normal and behavior is normal. Judgment and thought content normal. Cognition and memory are normal.   Nursing note and vitals reviewed.   Had removed bandaid to inspect sutures; new telfa 2x4cm bandage reapplied to left deltoid for patient.  No drainage noted on removed bandage.      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  Recently had labs completed 08/19/2016 WNL CBC/Executive panel/HgbA1c.  If  Tremors worsening consider executive panel   Positioning for cyst removal could have caused compression/impingement of ulnar nerve and paresthesias left forearm and 3-5th digits slightly decreased strength fingers 3-5 compared to right.  Right hand dominant.  Unlikely electrolyte imbalance/thyroid disorder/hyper/hypoglycemia with recent normal labs.  Patient anxious for pathology results from dermatology and not sleeping well.  Exitcare handout on impingement ulnar nerve/contusion and rehab exercises, cervical radiculopathy and tremors given to patient.  Instructed patient to contact dermatology office and notify them of tremors/paresthesias.  Follow up with PCM/ER/UC if worsening tremors, dropping items, falls, bowel/bladder incontinence and/or worsening pain.  Trial thermacare cervical applied from clinic stock today.  Biofreeze gel 4% may apply prn topical QID.  Demonstrated cervical  Stretches and discussed ergonomics work station with patient.  Given ice pack chemical for use on break later today.  Patient verbalized understanding information/instructions, agreed with plan of care and had no further questions at this time.

## 2017-06-21 ENCOUNTER — Ambulatory Visit: Payer: Self-pay | Admitting: Registered Nurse

## 2017-06-21 VITALS — BP 122/84 | HR 97 | Temp 97.8°F

## 2017-06-21 DIAGNOSIS — H6983 Other specified disorders of Eustachian tube, bilateral: Secondary | ICD-10-CM

## 2017-06-21 DIAGNOSIS — J310 Chronic rhinitis: Secondary | ICD-10-CM

## 2017-06-21 DIAGNOSIS — G4700 Insomnia, unspecified: Secondary | ICD-10-CM

## 2017-06-21 DIAGNOSIS — H6993 Unspecified Eustachian tube disorder, bilateral: Secondary | ICD-10-CM

## 2017-06-21 NOTE — Progress Notes (Signed)
Subjective:    Patient ID: Tracey Gibson, female    DOB: June 16, 1990, 27 y.o.   MRN: [id number]  26y/o caucasian established female pt c/o low grade fever at home, 99.6-99.8, x2 days. Also c/o inability to sleep x2 days. Describes as difficulty falling asleep and staying asleep. Typically going to sleep at 10pm and awakening 0500.  Showers before bed.  Caffeine only for breakfast.  Tuesdays plays on kickball team.  Other days walks her dog.  Tends to read her phone until she turns out lights. Max straight sleep time reported as 1 hour. Has taken Benadryl, which typically makes her sleepy, without no relief. Also taking Trazodone as Rx'd at bedtime which typically helps her sleep. One month ago, stopped Lexapro, switched to Cymbalta. Full one month wean from Lexapro, then built up to full dose of Cymbalta. No issues with med change until 2 days ago-unsure if related.   Boyfriend, taking CNA class unit exam due this week.  Class ends June.  Cymbalta started 3 weeks ago along with allegra D for allergies.  Has been using flonase doesn't seem to be helping getting sore throat/bad taste in mouth.  + sick contacts at work and seasonal allergies usually spring flare.  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  Will also be taking national certification exam after class ends in June.     Review of Systems  Constitutional: Positive for fatigue and fever. Negative for activity change, appetite change, chills, diaphoresis and unexpected weight change.  HENT: Positive for congestion, postnasal drip, rhinorrhea and sore throat. Negative for dental problem, drooling, ear discharge, ear pain, facial swelling, hearing loss, mouth sores, nosebleeds, sinus pressure, sinus pain, sneezing, tinnitus, trouble swallowing and voice change.   Eyes: Negative for photophobia, pain, discharge, redness, itching and visual disturbance.  Respiratory: Negative for cough, choking, chest tightness, shortness of  breath, wheezing and stridor.   Cardiovascular: Negative for chest pain, palpitations and leg swelling.  Gastrointestinal: Negative for abdominal distention, abdominal pain, blood in stool, constipation, diarrhea, nausea and vomiting.  Endocrine: Negative for cold intolerance and heat intolerance.  Genitourinary: Negative for difficulty urinating, dysuria and hematuria.  Musculoskeletal: Negative for arthralgias, back pain, gait problem, joint swelling, myalgias, neck pain and neck stiffness.  Skin: Negative for color change, pallor, rash and wound.  Allergic/Immunologic: Positive for environmental allergies. Negative for food allergies.  Neurological: Negative for dizziness, tremors, seizures, syncope, facial asymmetry, speech difficulty, weakness, light-headedness, numbness and headaches.  Hematological: Negative for adenopathy. Does not bruise/bleed easily.  Psychiatric/Behavioral: Positive for sleep disturbance. Negative for agitation and confusion.       Objective:   Physical Exam  Constitutional: She is oriented to person, place, and time. Vital signs are normal. She appears well-developed and well-nourished. She is active and cooperative.  Non-toxic appearance. She does not have a sickly appearance. She appears ill. No distress.  HENT:  Head: Normocephalic and atraumatic.  Right Ear: Hearing, external ear and ear canal normal. A middle ear effusion is present.  Left Ear: Hearing, external ear and ear canal normal. A middle ear effusion is present.  Nose: Mucosal edema and rhinorrhea present. No nose lacerations, sinus tenderness, nasal deformity, septal deviation or nasal septal hematoma. No epistaxis.  No foreign bodies. Right sinus exhibits no maxillary sinus tenderness and no frontal sinus tenderness. Left sinus exhibits no maxillary sinus tenderness and no frontal sinus tenderness.  Mouth/Throat: Uvula is midline. Mucous membranes are not pale, dry and  not cyanotic. She does not have  dentures. No oral lesions. No trismus in the jaw. Normal dentition. No dental abscesses, uvula swelling, lacerations or dental caries. Posterior oropharyngeal edema and posterior oropharyngeal erythema present. No oropharyngeal exudate or tonsillar abscesses.  Saliva tacky opaque on tongue; bilateral allergic shiners; bilateral TMs air fluid level clear; bilateral nasal turbinates edema/erythema clear yellow discharge  Eyes: Pupils are equal, round, and reactive to light. Conjunctivae, EOM and lids are normal. Right eye exhibits no chemosis, no discharge, no exudate and no hordeolum. No foreign body present in the right eye. Left eye exhibits no chemosis, no discharge, no exudate and no hordeolum. No foreign body present in the left eye. Right conjunctiva is not injected. Right conjunctiva has no hemorrhage. Left conjunctiva is not injected. Left conjunctiva has no hemorrhage. No scleral icterus. Right eye exhibits normal extraocular motion and no nystagmus. Left eye exhibits normal extraocular motion and no nystagmus. Right pupil is round and reactive. Left pupil is round and reactive. Pupils are equal.  Neck: Trachea normal, normal range of motion and phonation normal. Neck supple. No tracheal tenderness and no muscular tenderness present. No neck rigidity. No tracheal deviation, no edema, no erythema and normal range of motion present. No thyroid mass and no thyromegaly present.  Cardiovascular: Normal rate, regular rhythm, S1 normal, S2 normal, normal heart sounds and intact distal pulses. PMI is not displaced. Exam reveals no gallop, no distant heart sounds and no friction rub.  No murmur heard. Pulmonary/Chest: Effort normal and breath sounds normal. No accessory muscle usage or stridor. No respiratory distress. She has no decreased breath sounds. She has no wheezes. She has no rhonchi. She has no rales. She exhibits no tenderness.  No cough observed in exam room; spoke full sentences without difficulty   Abdominal: Soft. Normal appearance. She exhibits no distension, no fluid wave and no ascites. There is no rigidity and no guarding.  Musculoskeletal: Normal range of motion. She exhibits no edema or tenderness.       Right shoulder: Normal.       Left shoulder: Normal.       Right elbow: Normal.      Left elbow: Normal.       Right hip: Normal.       Left hip: Normal.       Right knee: Normal.       Left knee: Normal.       Cervical back: Normal.       Thoracic back: Normal.       Lumbar back: Normal.       Right hand: Normal.       Left hand: Normal.  Lymphadenopathy:       Head (right side): No submental, no submandibular, no tonsillar, no preauricular, no posterior auricular and no occipital adenopathy present.       Head (left side): No submental, no submandibular, no tonsillar, no preauricular, no posterior auricular and no occipital adenopathy present.    She has no cervical adenopathy.       Right cervical: No superficial cervical, no deep cervical and no posterior cervical adenopathy present.      Left cervical: No superficial cervical, no deep cervical and no posterior cervical adenopathy present.  Neurological: She is alert and oriented to person, place, and time. She has normal strength. She is not disoriented. She displays no atrophy and no tremor. No cranial nerve deficit or sensory deficit. She exhibits normal muscle tone. She displays no seizure activity.  Coordination and gait normal. GCS eye subscore is 4. GCS verbal subscore is 5. GCS motor subscore is 6.  In/out of chair and on/off exam table without difficulty gait sure and steady in hallway  Skin: Skin is warm, dry and intact. No abrasion, no bruising, no burn, no ecchymosis, no laceration, no lesion, no petechiae and no rash noted. She is not diaphoretic. No cyanosis or erythema. No pallor. Nails show no clubbing.  Psychiatric: She has a normal mood and affect. Her speech is normal and behavior is normal. Judgment and  thought content normal. Cognition and memory are normal.  Nursing note and vitals reviewed.         Assessment & Plan:  A-nonallergic rhinitis, eustachian tube dysfunction and insomnia  P-Stress from CNA class unit exam due this week class ends June then national certification test, recently restarted allegra D  and cymbalta initiated 3 weeks ago could all be contributing to insomnia.  Discussed with patient trial plain allegra, she is already taking cymbalta in am and half life 12 hours. Discussed sleep hygiene at length re: regular schedule, avoid screen time 2 hours prior to bed time, dark/quiet/cool bedroom, avoid large meals within 1 hour of sleeping; avoid strenuous exercise within one hour of bedtime; avoid caffeine use after lunch. Continueshower/bath; warm non-alcoholic/caffeinated drink prior to bed. She has already changed phone screen to yellow light night mode also.  Exitcare handout on insomnia given to patient. Follow up with PCM if no improvement in symptoms with above strategies.  Consider appt with Margaretmary Lombard at [facility]. Patient verbalized understanding of instructions, agreed with plan of care and had no further questions at this time.  P2: stress reduction, exercise   Reiterated how to use flonase-patient has been tasting in throat lighter sniff to keep in sinuses/nares.  Patient may use normal saline nasal spray 2 sprays each nostril q2h wa as needed. flonase 1 spray each nostril BID  Patient denied personal or family history of ENT cancer.  OTC antihistamine of choice allegra 180mg  po daily.  Sudafed (D) only when rhinitis not controlled with flonase, saline, shower before bedtime, limiting outside time during high pollen counts and allegra generic.  Avoid triggers if possible.  Shower prior to bedtime if exposed to triggers.  If allergic dust/dust mites recommend mattress/pillow covers/encasements; washing linens, vacuuming, sweeping, dusting weekly.  Call or  return to clinic as needed if these symptoms worsen or fail to improve as anticipated.   Exitcare handout on nonallergic rhinitis and sinus rinse given to patient.  Patient verbalized understanding of instructions, agreed with plan of care and had no further questions at this time.  P2:  Avoidance and hand washing.  Supportive treatment.   No evidence of invasive bacterial infection, non toxic and well hydrated.  This is most likely self limiting viral infection.  I do not see where any further testing or imaging is necessary at this time.   I will suggest supportive care, rest, good hygiene and encourage the patient to take adequate fluids.  The patient is to return to clinic or EMERGENCY ROOM if symptoms worsen or change significantly e.g. ear pain, fever, purulent discharge from ears or bleeding.  Exitcare handout on eustachian tube dysfunction given to patient.  Patient verbalized agreement and understanding of treatment plan.

## 2017-06-21 NOTE — Patient Instructions (Addendum)
Insomnia Insomnia is a sleep disorder that makes it difficult to fall asleep or to stay asleep. Insomnia can cause tiredness (fatigue), low energy, difficulty concentrating, mood swings, and poor performance at work or school. There are three different ways to classify insomnia:  Difficulty falling asleep.  Difficulty staying asleep.  Waking up too early in the morning.  Any type of insomnia can be long-term (chronic) or short-term (acute). Both are common. Short-term insomnia usually lasts for three months or less. Chronic insomnia occurs at least three times a week for longer than three months. What are the causes? Insomnia may be caused by another condition, situation, or substance, such as:  Anxiety.  Certain medicines.  Gastroesophageal reflux disease (GERD) or other gastrointestinal conditions.  Asthma or other breathing conditions.  Restless legs syndrome, sleep apnea, or other sleep disorders.  Chronic pain.  Menopause. This may include hot flashes.  Stroke.  Abuse of alcohol, tobacco, or illegal drugs.  Depression.  Caffeine.  Neurological disorders, such as Alzheimer disease.  An overactive thyroid (hyperthyroidism).  The cause of insomnia may not be known. What increases the risk? Risk factors for insomnia include:  Gender. Women are more commonly affected than men.  Age. Insomnia is more common as you get older.  Stress. This may involve your professional or personal life.  Income. Insomnia is more common in people with lower income.  Lack of exercise.  Irregular work schedule or night shifts.  Traveling between different time zones.  What are the signs or symptoms? If you have insomnia, trouble falling asleep or trouble staying asleep is the main symptom. This may lead to other symptoms, such as:  Feeling fatigued.  Feeling nervous about going to sleep.  Not feeling rested in the morning.  Having trouble concentrating.  Feeling  irritable, anxious, or depressed.  How is this treated? Treatment for insomnia depends on the cause. If your insomnia is caused by an underlying condition, treatment will focus on addressing the condition. Treatment may also include:  Medicines to help you sleep.  Counseling or therapy.  Lifestyle adjustments.  Follow these instructions at home:  Take medicines only as directed by your health care provider.  Keep regular sleeping and waking hours. Avoid naps.  Keep a sleep diary to help you and your health care provider figure out what could be causing your insomnia. Include: ? When you sleep. ? When you wake up during the night. ? How well you sleep. ? How rested you feel the next day. ? Any side effects of medicines you are taking. ? What you eat and drink.  Make your bedroom a comfortable place where it is easy to fall asleep: ? Put up shades or special blackout curtains to block light from outside. ? Use a white noise machine to block noise. ? Keep the temperature cool.  Exercise regularly as directed by your health care provider. Avoid exercising right before bedtime.  Use relaxation techniques to manage stress. Ask your health care provider to suggest some techniques that may work well for you. These may include: ? Breathing exercises. ? Routines to release muscle tension. ? Visualizing peaceful scenes.  Cut back on alcohol, caffeinated beverages, and cigarettes, especially close to bedtime. These can disrupt your sleep.  Do not overeat or eat spicy foods right before bedtime. This can lead to digestive discomfort that can make it hard for you to sleep.  Limit screen use before bedtime. This includes: ? Watching TV. ? Using your smartphone, tablet, and   computer.  Stick to a routine. This can help you fall asleep faster. Try to do a quiet activity, brush your teeth, and go to bed at the same time each night.  Get out of bed if you are still awake after 15 minutes  of trying to sleep. Keep the lights down, but try reading or doing a quiet activity. When you feel sleepy, go back to bed.  Make sure that you drive carefully. Avoid driving if you feel very sleepy.  Keep all follow-up appointments as directed by your health care provider. This is important. Contact a health care provider if:  You are tired throughout the day or have trouble in your daily routine due to sleepiness.  You continue to have sleep problems or your sleep problems get worse. Get help right away if:  You have serious thoughts about hurting yourself or someone else. This information is not intended to replace advice given to you by your health care provider. Make sure you discuss any questions you have with your health care provider. Document Released: 02/13/2000 Document Revised: 07/18/2015 Document Reviewed: 11/16/2013 Elsevier Interactive Patient Education  2018 [employer]. Allergic Rhinitis, Adult Allergic rhinitis is an allergic reaction that affects the mucous membrane inside the nose. It causes sneezing, a runny or stuffy nose, and the feeling of mucus going down the back of the throat (postnasal drip). Allergic rhinitis can be mild to severe. There are two types of allergic rhinitis:  Seasonal. This type is also called hay fever. It happens only during certain seasons.  Perennial. This type can happen at any time of the year.  What are the causes? This condition happens when the body's defense system (immune system) responds to certain harmless substances called allergens as though they were germs.  Seasonal allergic rhinitis is triggered by pollen, which can come from grasses, trees, and weeds. Perennial allergic rhinitis may be caused by:  House dust mites.  Pet dander.  Mold spores.  What are the signs or symptoms? Symptoms of this condition include:  Sneezing.  Runny or stuffy nose (nasal congestion).  Postnasal drip.  Itchy nose.  Tearing of the  eyes.  Trouble sleeping.  Daytime sleepiness.  How is this diagnosed? This condition may be diagnosed based on:  Your medical history.  A physical exam.  Tests to check for related conditions, such as: ? Asthma. ? Pink eye. ? Ear infection. ? Upper respiratory infection.  Tests to find out which allergens trigger your symptoms. These may include skin or blood tests.  How is this treated? There is no cure for this condition, but treatment can help control symptoms. Treatment may include:  Taking medicines that block allergy symptoms, such as antihistamines. Medicine may be given as a shot, nasal spray, or pill.  Avoiding the allergen.  Desensitization. This treatment involves getting ongoing shots until your body becomes less sensitive to the allergen. This treatment may be done if other treatments do not help.  If taking medicine and avoiding the allergen does not work, new, stronger medicines may be prescribed.  Follow these instructions at home:  Find out what you are allergic to. Common allergens include smoke, dust, and pollen.  Avoid the things you are allergic to. These are some things you can do to help avoid allergens: ? Replace carpet with wood, tile, or vinyl flooring. Carpet can trap dander and dust. ? Do not smoke. Do not allow smoking in your home. ? Change your heating and air conditioning filter at  least once a month. ? During allergy season:  Keep windows closed as much as possible.  Plan outdoor activities when pollen counts are lowest. This is usually during the evening hours.  When coming indoors, change clothing and shower before sitting on furniture or bedding.  Take over-the-counter and prescription medicines only as told by your health care provider.  Keep all follow-up visits as told by your health care provider. This is important. Contact a health care provider if:  You have a fever.  You develop a persistent cough.  You make whistling  sounds when you breathe (you wheeze).  Your symptoms interfere with your normal daily activities. Get help right away if:  You have shortness of breath. Summary  This condition can be managed by taking medicines as directed and avoiding allergens.  Contact your health care provider if you develop a persistent cough or fever.  During allergy season, keep windows closed as much as possible. This information is not intended to replace advice given to you by your health care provider. Make sure you discuss any questions you have with your health care provider. Document Released: 11/10/2000 Document Revised: 03/25/2016 Document Reviewed: 03/25/2016 Elsevier Interactive Patient Education  2018 [employer]. Sinus Rinse What is a sinus rinse? A sinus rinse is a simple home treatment that is used to rinse your sinuses with a sterile mixture of salt and water (saline solution). Sinuses are air-filled spaces in your skull behind the bones of your face and forehead that open into your nasal cavity. You will use the following:  Saline solution.  Neti pot or spray bottle. This releases the saline solution into your nose and through your sinuses. Neti pots and spray bottles can be purchased at [employer], a health food store, or online.  When would I do a sinus rinse? A sinus rinse can help to clear mucus, dirt, dust, or pollen from the nasal cavity. You may do a sinus rinse when you have a cold, a virus, nasal allergy symptoms, a sinus infection, or stuffiness in the nose or sinuses. If you are considering a sinus rinse:  Ask your child's health care provider before performing a sinus rinse on your child.  Do not do a sinus rinse if you have had ear or nasal surgery, ear infection, or blocked ears.  How do I do a sinus rinse?  Wash your hands.  Disinfect your device according to the directions provided and then dry it.  Use the solution that comes with your device or one that is  sold separately in stores. Follow the mixing directions on the package.  Fill your device with the amount of saline solution as directed by the device instructions.  Stand over a sink and tilt your head sideways over the sink.  Place the spout of the device in your upper nostril (the one closer to the ceiling).  Gently pour or squeeze the saline solution into the nasal cavity. The liquid should drain to the lower nostril if you are not overly congested.  Gently blow your nose. Blowing too hard may cause ear pain.  Repeat in the other nostril.  Clean and rinse your device with clean water and then air-dry it. Are there risks of a sinus rinse? Sinus rinse is generally very safe and effective. However, there are a few risks, which include:  A burning sensation in the sinuses. This may happen if you do not make the saline solution as directed. Make sure to follow all directions  when making the saline solution.  Infection from contaminated water. This is rare, but possible.  Nasal irritation.  This information is not intended to replace advice given to you by your health care provider. Make sure you discuss any questions you have with your health care provider. Document Released: 09/12/2013 Document Revised: 01/13/2016 Document Reviewed: 07/03/2013 Elsevier Interactive Patient Education  2017 [employer] Nonallergic Rhinitis  1. Nonallergic rhinitis is a term used by allergist to describe inflammation in the nose that is not due to an allergic source (i.e., pollens, mold, animal dander or dust mites). 2. Nonallergic rhinitis can mimic many of the symptoms caused by allergies.  This includes a runny nose ("rhinorrhea"), sneezing, congestion, ear fullness and post nasal drip. 3. These symptoms usually occur year round but can be made worse by many environmental factors including weather change, irritants such as cigarette smoke, fumes, perfumes, detergents and many others. These are not  allergens, but are irritants, and do not cause the formation of antibodies like true allergens.  For this reason most people with nonallergic rhinitis have negative skin tests. 4. Unfortunately, we have a poor understanding of what causes nonallergic rhinitis but certain factors such as deviated septum, sinusitis and nasal polyps may contribute to the symptoms.  Due to the poor understanding of what causes nonallergic rhinitis it cannot be cured and our treatment is only symptomatic to control symptoms. 5. Important factors in the successful treatment of nonallergic rhinitis include avoidance of the irritants that cause symptoms; for example, people who continue to smoke may never improve.  Continuous therapy works better than intermittent.  This may mean taking medications once daily or 3-4 times a day. Helpful treatments include nasal saline lavage, nasal ipratropium (Atrovent), nasal steroids, and decongestants.  Pure antihistamines don't seem to work very well.  Immunotherapy (allergy shots) has no role in the treatment of nonallergic rhinitis.  Several medications may have to be tried before a good combination is found for you.  These medications, in general, are safe for long term use.  Tolerance may develop to the therapeutic effects of these medications: Frequently changing between several helpful medications can prevent this should it occur. Eustachian Tube Dysfunction The eustachian tube connects the middle ear to the back of the nose. It regulates air pressure in the middle ear by allowing air to move between the ear and nose. It also helps to drain fluid from the middle ear space. When the eustachian tube does not function properly, air pressure, fluid, or both can build up in the middle ear. Eustachian tube dysfunction can affect one or both ears. What are the causes? This condition happens when the eustachian tube becomes blocked or cannot open normally. This may result from: Ear  infections. Colds and other upper respiratory infections. Allergies. Irritation, such as from cigarette smoke or acid from the stomach coming up into the esophagus (gastroesophageal reflux). Sudden changes in air pressure, such as from descending in an airplane. Abnormal growths in the nose or throat, such as nasal polyps, tumors, or enlarged tissue at the back of the throat (adenoids).  What increases the risk? This condition may be more likely to develop in people who smoke and people who are overweight. Eustachian tube dysfunction may also be more likely to develop in children, especially children who have: Certain birth defects of the mouth, such as cleft palate. Large tonsils and adenoids.  What are the signs or symptoms? Symptoms of this condition may include: A feeling of fullness in  the ear. Ear pain. Clicking or popping noises in the ear. Ringing in the ear. Hearing loss. Loss of balance.  Symptoms may get worse when the air pressure around you changes, such as when you travel to an area of high elevation or fly on an airplane. How is this diagnosed? This condition may be diagnosed based on: Your symptoms. A physical exam of your ear, nose, and throat. Tests, such as those that measure: The movement of your eardrum (tympanogram). Your hearing (audiometry).  How is this treated? Treatment depends on the cause and severity of your condition. If your symptoms are mild, you may be able to relieve your symptoms by moving air into ("popping") your ears. If you have symptoms of fluid in your ears, treatment may include: Decongestants. Antihistamines. Nasal sprays or ear drops that contain medicines that reduce swelling (steroids).  In some cases, you may need to have a procedure to drain the fluid in your eardrum (myringotomy). In this procedure, a small tube is placed in the eardrum to: Drain the fluid. Restore the air in the middle ear space.  Follow these instructions at  home: Take over-the-counter and prescription medicines only as told by your health care provider. Use techniques to help pop your ears as recommended by your health care provider. These may include: Chewing gum. Yawning. Frequent, forceful swallowing. Closing your mouth, holding your nose closed, and gently blowing as if you are trying to blow air out of your nose. Do not do any of the following until your health care provider approves: Travel to high altitudes. Fly in airplanes. Work in a Estate agent or room. Scuba dive. Keep your ears dry. Dry your ears completely after showering or bathing. Do not smoke. Keep all follow-up visits as told by your health care provider. This is important. Contact a health care provider if: Your symptoms do not go away after treatment. Your symptoms come back after treatment. You are unable to pop your ears. You have: A fever. Pain in your ear. Pain in your head or neck. Fluid draining from your ear. Your hearing suddenly changes. You become very dizzy. You lose your balance. This information is not intended to replace advice given to you by your health care provider. Make sure you discuss any questions you have with your health care provider. Document Released: 03/14/2015 Document Revised: 07/24/2015 Document Reviewed: 03/06/2014 Elsevier Interactive Patient Education  [employer]. 6.

## 2017-07-12 ENCOUNTER — Ambulatory Visit: Payer: Self-pay | Admitting: Registered Nurse

## 2017-07-12 VITALS — BP 112/76 | HR 100 | Temp 97.7°F

## 2017-07-12 DIAGNOSIS — B9789 Other viral agents as the cause of diseases classified elsewhere: Secondary | ICD-10-CM

## 2017-07-12 DIAGNOSIS — H6692 Otitis media, unspecified, left ear: Secondary | ICD-10-CM

## 2017-07-12 DIAGNOSIS — J069 Acute upper respiratory infection, unspecified: Secondary | ICD-10-CM

## 2017-07-12 MED ORDER — AMOXICILLIN-POT CLAVULANATE 875-125 MG PO TABS: 1.0000 | ORAL_TABLET | Freq: Two times a day (BID) | ORAL | 0 refills | Status: AC

## 2017-07-12 NOTE — Progress Notes (Signed)
Subjective:    Patient ID: Tracey Gibson, female    DOB: 09/22/1990, 27 y.o.   MRN: [id number]  26y/o Caucasian single established female pt c/o body aches, productive cough, nasal and chest congestion, L otalgia x4 days, progressively worsening. Using Zrytec-D, Mucinex Cold&Flu. Last seen 06/21/17 for viral URI/insomnia/seasonal allergies.  Chest sore from coughing.  Sat on couch 2 days last week didn't work due to muscle aches/fatigue.  Drinking tea with honey.  Had stopped flonase because she thought taking too much medicine.  Plenty of nasal saline at home.  Feeling better today except left ear and wondering about her cough still not slowing down.  Denied wheezing/SOB/hemoptysis.     Review of Systems  Constitutional: Positive for fatigue. Negative for activity change, appetite change, chills, diaphoresis, fever and unexpected weight change.  HENT: Positive for congestion, ear pain, postnasal drip and rhinorrhea. Negative for dental problem, drooling, ear discharge, facial swelling, hearing loss, mouth sores, nosebleeds, sinus pressure, sinus pain, sneezing, sore throat, tinnitus, trouble swallowing and voice change.   Eyes: Negative for photophobia, pain, discharge, redness, itching and visual disturbance.  Respiratory: Positive for cough. Negative for choking, chest tightness, shortness of breath, wheezing and stridor.   Cardiovascular: Negative for chest pain, palpitations and leg swelling.  Gastrointestinal: Negative for abdominal distention, abdominal pain, blood in stool, constipation, diarrhea, nausea and vomiting.  Endocrine: Negative for cold intolerance and heat intolerance.  Genitourinary: Negative for difficulty urinating, dysuria and hematuria.  Musculoskeletal: Positive for myalgias. Negative for arthralgias, back pain, gait problem, joint swelling, neck pain and neck stiffness.  Skin: Negative for color change, pallor, rash and wound.  Allergic/Immunologic: Positive for  environmental allergies. Negative for food allergies.  Neurological: Negative for dizziness, tremors, seizures, syncope, facial asymmetry, speech difficulty, weakness, light-headedness, numbness and headaches.  Hematological: Negative for adenopathy. Does not bruise/bleed easily.  Psychiatric/Behavioral: Negative for agitation, confusion and sleep disturbance.       Objective:   Physical Exam  Constitutional: She is oriented to person, place, and time. Vital signs are normal. She appears well-developed and well-nourished. She is active and cooperative.  Non-toxic appearance. She does not have a sickly appearance. She appears ill. No distress.  HENT:  Head: Normocephalic and atraumatic.  Right Ear: Hearing, external ear and ear canal normal. A middle ear effusion is present.  Left Ear: Hearing, external ear and ear canal normal. Tympanic membrane is erythematous and bulging. A middle ear effusion is present.  Nose: Mucosal edema and rhinorrhea present. No nose lacerations, sinus tenderness, nasal deformity, septal deviation or nasal septal hematoma. No epistaxis.  No foreign bodies. Right sinus exhibits no maxillary sinus tenderness and no frontal sinus tenderness. Left sinus exhibits no maxillary sinus tenderness and no frontal sinus tenderness.  Mouth/Throat: Uvula is midline and mucous membranes are normal. Mucous membranes are not pale, not dry and not cyanotic. She does not have dentures. No oral lesions. No trismus in the jaw. Normal dentition. No dental abscesses, uvula swelling, lacerations or dental caries. Posterior oropharyngeal edema and posterior oropharyngeal erythema present. No oropharyngeal exudate or tonsillar abscesses.  Bilateral nasal turbinates edema erythema clear discharge; cobblestoning posterior pharynx; bilateral TMs air fluid level centrally slight opacity and 12 oclock left greater than right erythema bulging; bilateral allergic shiners; frequent nose clearing  Eyes:  Pupils are equal, round, and reactive to light. Conjunctivae, EOM and lids are normal. Right eye exhibits no chemosis, no discharge, no exudate and no hordeolum. No foreign body present in the  right eye. Left eye exhibits no chemosis, no discharge, no exudate and no hordeolum. No foreign body present in the left eye. Right conjunctiva is not injected. Right conjunctiva has no hemorrhage. Left conjunctiva is not injected. Left conjunctiva has no hemorrhage. No scleral icterus. Right eye exhibits normal extraocular motion and no nystagmus. Left eye exhibits normal extraocular motion and no nystagmus. Right pupil is round and reactive. Left pupil is round and reactive. Pupils are equal.  Neck: Trachea normal, normal range of motion and phonation normal. Neck supple. No tracheal tenderness, no spinous process tenderness and no muscular tenderness present. No neck rigidity. No tracheal deviation, no edema, no erythema and normal range of motion present. No thyroid mass and no thyromegaly present.  Cardiovascular: Normal rate, regular rhythm, S1 normal, S2 normal, normal heart sounds and intact distal pulses. PMI is not displaced. Exam reveals no gallop, no distant heart sounds and no friction rub.  No murmur heard. Pulmonary/Chest: Effort normal and breath sounds normal. No accessory muscle usage or stridor. No respiratory distress. She has no decreased breath sounds. She has no wheezes. She has no rhonchi. She has no rales. She exhibits no tenderness.  Spoke full sentences without difficulty; cough not observed in exam room  Abdominal: Soft. Normal appearance. She exhibits no distension and no fluid wave. There is no rigidity and no guarding.  Musculoskeletal: Normal range of motion. She exhibits no edema, tenderness or deformity.       Right shoulder: Normal.       Left shoulder: Normal.       Right elbow: Normal.      Left elbow: Normal.       Right hip: Normal.       Left hip: Normal.       Right knee:  Normal.       Left knee: Normal.       Cervical back: Normal.       Thoracic back: She exhibits pain. She exhibits normal range of motion, no tenderness, no bony tenderness, no swelling, no edema, no deformity, no laceration, no spasm and normal pulse.       Lumbar back: She exhibits pain. She exhibits normal range of motion, no tenderness, no swelling, no edema, no deformity, no laceration, no spasm and normal pulse.       Back:       Right hand: Normal.       Left hand: Normal.  Lymphadenopathy:       Head (right side): No submental, no submandibular, no tonsillar, no preauricular, no posterior auricular and no occipital adenopathy present.       Head (left side): No submental, no submandibular, no tonsillar, no preauricular, no posterior auricular and no occipital adenopathy present.    She has no cervical adenopathy.       Right cervical: No superficial cervical, no deep cervical and no posterior cervical adenopathy present.      Left cervical: No superficial cervical, no deep cervical and no posterior cervical adenopathy present.  Neurological: She is alert and oriented to person, place, and time. She has normal strength. She is not disoriented. She displays no atrophy and no tremor. No cranial nerve deficit or sensory deficit. She exhibits normal muscle tone. She displays no seizure activity. Coordination and gait normal. GCS eye subscore is 4. GCS verbal subscore is 5. GCS motor subscore is 6.  In/ouf of chair; on/off exam table without difficulty; gait sure and steady in hallway  Skin: Skin is  warm, dry and intact. Capillary refill takes less than 2 seconds. No abrasion, no bruising, no burn, no ecchymosis, no laceration, no lesion, no petechiae and no rash noted. She is not diaphoretic. No cyanosis or erythema. No pallor. Nails show no clubbing.  Psychiatric: She has a normal mood and affect. Her speech is normal and behavior is normal. Judgment and thought content normal. She is not  actively hallucinating. Cognition and memory are normal. She is attentive.  Nursing note and vitals reviewed.         Assessment & Plan:  A-left acute otitis media, viral URI with cough  P-augmentin  po BID x 10 days #20 RF0 dispensed from [facility].  Uses boric acid if she gets vaginal yeast after antibiotics vaginal cream and diflucan don't work for her.  Supportive treatment.   No evidence of invasive bacterial infection, non toxic and well hydrated.  This is most likely self limiting viral infection.  I do not see where any further testing or imaging is necessary at this time.   I will suggest supportive care, rest, good hygiene and encourage the patient to take adequate fluids.  The patient is to return to clinic or EMERGENCY ROOM if symptoms worsen or change significantly e.g. ear pain, fever, purulent discharge from ears or bleeding.  Exitcare handout on otitis media  given to patient.  Patient verbalized agreement and understanding of treatment plan.    Discussed viral mucous typically clear to yellow to green then done.  Bacterial typically more opaque brown/tan/rose.  Stop mucinex, restart flonase 1 spray each nostril BID at home.  Continue zyrtec D po daily.  Honey with lemon.  Reiterated how to use flonase-patient has been tasting in throat lighter sniff to keep in sinuses/nares.  Patient may use normal saline nasal spray 2 sprays each nostril q2h wa as needed. flonase 1 spray each nostril BID  Patient denied personal or family history of ENT cancer.  OTC antihistamine of choice daily.  Sudafed (D) only when rhinitis not controlled with flonase, saline, shower before bedtime, limiting outside time during high pollen counts and allegra generic.  Avoid triggers if possible.  Shower prior to bedtime if exposed to triggers.  If allergic dust/dust mites recommend mattress/pillow covers/encasements; washing linens, vacuuming, sweeping, dusting weekly.  Call or return to clinic as needed if  these symptoms worsen or fail to improve as anticipated.   Exitcare handout on nonallergic rhinitis and sinus rinse given to patient.  Patient verbalized understanding of instructions, agreed with plan of care and had no further questions at this time.  P2:  Avoidance and hand washing.

## 2017-07-12 NOTE — Patient Instructions (Signed)
Viral Respiratory Infection A respiratory infection is an illness that affects part of the respiratory system, such as the lungs, nose, or throat. Most respiratory infections are caused by either viruses or bacteria. A respiratory infection that is caused by a virus is called a viral respiratory infection. Common types of viral respiratory infections include:  A cold.  The flu (influenza).  A respiratory syncytial virus (RSV) infection.  How do I know if I have a viral respiratory infection? Most viral respiratory infections cause:  A stuffy or runny nose.  Yellow or green nasal discharge.  A cough.  Sneezing.  Fatigue.  Achy muscles.  A sore throat.  Sweating or chills.  A fever.  A headache.  How are viral respiratory infections treated? If influenza is diagnosed early, it may be treated with an antiviral medicine that shortens the length of time a person has symptoms. Symptoms of viral respiratory infections may be treated with over-the-counter and prescription medicines, such as:  Expectorants. These make it easier to cough up mucus.  Decongestant nasal sprays.  Health care providers do not prescribe antibiotic medicines for viral infections. This is because antibiotics are designed to kill bacteria. They have no effect on viruses. How do I know if I should stay home from work or school? To avoid exposing others to your respiratory infection, stay home if you have:  A fever.  A persistent cough.  A sore throat.  A runny nose.  Sneezing.  Muscles aches.  Headaches.  Fatigue.  Weakness.  Chills.  Sweating.  Nausea.  Follow these instructions at home:  Rest as much as possible.  Take over-the-counter and prescription medicines only as told by your health care provider.  Drink enough fluid to keep your urine clear or pale yellow. This helps prevent dehydration and helps loosen up mucus.  Gargle with a salt-water mixture 3-4 times per day or  as needed. To make a salt-water mixture, completely dissolve -1 tsp of salt in 1 cup of warm water.  Use nose drops made from salt water to ease congestion and soften raw skin around your nose.  Do not drink alcohol.  Do not use tobacco products, including cigarettes, chewing tobacco, and e-cigarettes. If you need help quitting, ask your health care provider. Contact a health care provider if:  Your symptoms last for 10 days or longer.  Your symptoms get worse over time.  You have a fever.  You have severe sinus pain in your face or forehead.  The glands in your jaw or neck become very swollen. Get help right away if:  You feel pain or pressure in your chest.  You have shortness of breath.  You faint or feel like you will faint.  You have severe and persistent vomiting.  You feel confused or disoriented. This information is not intended to replace advice given to you by your health care provider. Make sure you discuss any questions you have with your health care provider. Document Released: 11/25/2004 Document Revised: 07/24/2015 Document Reviewed: 07/24/2014 Elsevier Interactive Patient Education  2018 [employer]. Otitis Media, Adult Otitis media occurs when there is inflammation and fluid in the middle ear. Your middle ear is a part of the ear that contains bones for hearing as well as air that helps send sounds to your brain. What are the causes? This condition is caused by a blockage in the eustachian tube. This tube drains fluid from the ear to the back of the nose (nasopharynx). A blockage in this  tube can be caused by an object or by swelling (edema) in the tube. Problems that can cause a blockage include:  A cold or other upper respiratory infection.  Allergies.  An irritant, such as tobacco smoke.  Enlarged adenoids. The adenoids are areas of soft tissue located high in the back of the throat, behind the nose and the roof of the mouth.  A mass in the  nasopharynx.  Damage to the ear caused by pressure changes (barotrauma).  What are the signs or symptoms? Symptoms of this condition include:  Ear pain.  A fever.  Decreased hearing.  A headache.  Tiredness (lethargy).  Fluid leaking from the ear.  Ringing in the ear.  How is this diagnosed? This condition is diagnosed with a physical exam. During the exam your health care provider will use an instrument called an otoscope to look into your ear and check for redness, swelling, and fluid. He or she will also ask about your symptoms. Your health care provider may also order tests, such as:  A test to check the movement of the eardrum (pneumatic otoscopy). This test is done by squeezing a small amount of air into the ear.  A test that changes air pressure in the middle ear to check how well the eardrum moves and whether the eustachian tube is working (tympanogram).  How is this treated? This condition usually goes away on its own within 3-5 days. But if the condition is caused by a bacteria infection and does not go away own its own, or keeps coming back, your health care provider may:  Prescribe antibiotic medicines to treat the infection.  Prescribe or recommend medicines to control pain.  Follow these instructions at home:  Take over-the-counter and prescription medicines only as told by your health care provider.  If you were prescribed an antibiotic medicine, take it as told by your health care provider. Do not stop taking the antibiotic even if you start to feel better.  Keep all follow-up visits as told by your health care provider. This is important. Contact a health care provider if:  You have bleeding from your nose.  There is a lump on your neck.  You are not getting better in 5 days.  You feel worse instead of better. Get help right away if:  You have severe pain that is not controlled with medicine.  You have swelling, redness, or pain around your  ear.  You have stiffness in your neck.  A part of your face is paralyzed.  The bone behind your ear (mastoid) is tender when you touch it.  You develop a severe headache. Summary  Otitis media is redness, soreness, and swelling of the middle ear.  This condition usually goes away on its own within 3-5 days.  If the problem does not go away in 3-5 days, your health care provider may prescribe or recommend medicines to treat your symptoms.  If you were prescribed an antibiotic medicine, take it as told by your health care provider. This information is not intended to replace advice given to you by your health care provider. Make sure you discuss any questions you have with your health care provider. Document Released: 11/21/2003 Document Revised: 02/06/2016 Document Reviewed: 02/06/2016 Elsevier Interactive Patient Education  [employer].

## 2017-08-16 ENCOUNTER — Ambulatory Visit: Payer: Self-pay | Admitting: Registered Nurse

## 2017-08-16 ENCOUNTER — Encounter: Payer: Self-pay | Admitting: Registered Nurse

## 2017-08-16 VITALS — BP 103/73 | HR 103 | Temp 97.9°F

## 2017-08-16 DIAGNOSIS — L03119 Cellulitis of unspecified part of limb: Secondary | ICD-10-CM

## 2017-08-16 MED ORDER — CEPHALEXIN 500 MG PO CAPS: 500.0000 mg | ORAL_CAPSULE | Freq: Three times a day (TID) | ORAL | 0 refills | Status: AC

## 2017-08-16 MED ORDER — ACETAMINOPHEN 500 MG PO TABS: 1000.0000 mg | ORAL_TABLET | Freq: Four times a day (QID) | ORAL | 0 refills | Status: AC | PRN

## 2017-08-16 NOTE — Patient Instructions (Signed)
Epidermal Cyst An epidermal cyst is sometimes called an epidermal inclusion cyst or an infundibular cyst. It is a sac made of skin tissue. The sac contains a substance called keratin. Keratin is a protein that is normally secreted through the hair follicles. When keratin becomes trapped in the top layer of skin (epidermis), it can form an epidermal cyst. Epidermal cysts are usually found on the face, neck, trunk, and genitals. These cysts are usually harmless (benign), and they may not cause symptoms unless they become infected. It is important not to pop epidermal cysts yourself. What are the causes? This condition may be caused by:  A blocked hair follicle.  A hair that curls and re-enters the skin instead of growing straight out of the skin (ingrown hair).  A blocked pore.  Irritated skin.  An injury to the skin.  Certain conditions that are passed along from parent to child (inherited).  Human papillomavirus (HPV).  What increases the risk? The following factors may make you more likely to develop an epidermal cyst:  Having acne.  Being overweight.  Wearing tight clothing.  What are the signs or symptoms? The only symptom of this condition may be a small, painless lump underneath the skin. When an epidermal cyst becomes infected, symptoms may include:  Redness.  Inflammation.  Tenderness.  Warmth.  Fever.  Keratin draining from the cyst. Keratin may look like a grayish-white, bad-smelling substance.  Pus draining from the cyst.  How is this diagnosed? This condition is diagnosed with a physical exam. In some cases, you may have a sample of tissue (biopsy) taken from your cyst to be examined under a microscope or tested for bacteria. You may be referred to a health care provider who specializes in skin care (dermatologist). How is this treated? In many cases, epidermal cysts go away on their own without treatment. If a cyst becomes infected, treatment may  include:  Opening and draining the cyst. After draining, minor surgery to remove the rest of the cyst may be done.  Antibiotic medicine to help prevent infection.  Injections of medicines (steroids) that help to reduce inflammation.  Surgery to remove the cyst. Surgery may be done if: ? The cyst becomes large. ? The cyst bothers you. ? There is a chance that the cyst could turn into cancer.  Follow these instructions at home:  Take over-the-counter and prescription medicines only as told by your health care provider.  If you were prescribed an antibiotic, use it as told by your health care provider. Do not stop using the antibiotic even if you start to feel better.  Keep the area around your cyst clean and dry.  Wear loose, dry clothing.  Do not try to pop your cyst.  Avoid touching your cyst.  Check your cyst every day for signs of infection.  Keep all follow-up visits as told by your health care provider. This is important. How is this prevented?  Wear clean, dry, clothing.  Avoid wearing tight clothing.  Keep your skin clean and dry. Shower or take baths every day.  Wash your body with a benzoyl peroxide wash when you shower or bathe. Contact a health care provider if:  Your cyst develops symptoms of infection.  Your condition is not improving or is getting worse.  You develop a cyst that looks different from other cysts you have had.  You have a fever. Get help right away if:  Redness spreads from the cyst into the surrounding area. This information is   not intended to replace advice given to you by your health care provider. Make sure you discuss any questions you have with your health care provider. Document Released: 01/17/2004 Document Revised: 10/15/2015 Document Reviewed: 12/18/2014 Elsevier Interactive Patient Education  2018 [employer]. Cellulitis, Adult Cellulitis is a skin infection. The infected area is usually red and tender. This condition  occurs most often in the arms and lower legs. The infection can travel to the muscles, blood, and underlying tissue and become serious. It is very important to get treated for this condition. What are the causes? Cellulitis is caused by bacteria. The bacteria enter through a break in the skin, such as a cut, burn, insect bite, open sore, or crack. What increases the risk? This condition is more likely to occur in people who:  Have a weak defense system (immune system).  Have open wounds on the skin such as cuts, burns, bites, and scrapes. Bacteria can enter the body through these open wounds.  Are older.  Have diabetes.  Have a type of long-lasting (chronic) liver disease (cirrhosis) or kidney disease.  Use IV drugs.  What are the signs or symptoms? Symptoms of this condition include:  Redness, streaking, or spotting on the skin.  Swollen area of the skin.  Tenderness or pain when an area of the skin is touched.  Warm skin.  Fever.  Chills.  Blisters.  How is this diagnosed? This condition is diagnosed based on a medical history and physical exam. You may also have tests, including:  Blood tests.  Lab tests.  Imaging tests.  How is this treated? Treatment for this condition may include:  Medicines, such as antibiotic medicines or antihistamines.  Supportive care, such as rest and application of cold or warm cloths (cold or warm compresses) to the skin.  Hospital care, if the condition is severe.  The infection usually gets better within 1-2 days of treatment. Follow these instructions at home:  Take over-the-counter and prescription medicines only as told by your health care provider.  If you were prescribed an antibiotic medicine, take it as told by your health care provider. Do not stop taking the antibiotic even if you start to feel better.  Drink enough fluid to keep your urine clear or pale yellow.  Do not touch or rub the infected area.  Raise  (elevate) the infected area above the level of your heart while you are sitting or lying down.  Apply warm or cold compresses to the affected area as told by your health care provider.  Keep all follow-up visits as told by your health care provider. This is important. These visits let your health care provider make sure a more serious infection is not developing. Contact a health care provider if:  You have a fever.  Your symptoms do not improve within 1-2 days of starting treatment.  Your bone or joint underneath the infected area becomes painful after the skin has healed.  Your infection returns in the same area or another area.  You notice a swollen bump in the infected area.  You develop new symptoms.  You have a general ill feeling (malaise) with muscle aches and pains. Get help right away if:  Your symptoms get worse.  You feel very sleepy.  You develop vomiting or diarrhea that persists.  You notice red streaks coming from the infected area.  Your red area gets larger or turns dark in color. This information is not intended to replace advice given to you by  your health care provider. Make sure you discuss any questions you have with your health care provider. Document Released: 11/25/2004 Document Revised: 06/26/2015 Document Reviewed: 12/25/2014 Elsevier Interactive Patient Education  [employer]2018 [employer]

## 2017-08-16 NOTE — Progress Notes (Signed)
Subjective:    Patient ID: Tracey Gibson, female    DOB: 10/12/1990, 27 y.o.   MRN: [id number]  27 y/o Caucasian single established female pt reporting sebaceous cyst to L upper arm that ruptured and was removed in Sept 2018 by dermatology and has now recurred in same area. Is smaller than previous but began oozing yesterday, thick, chunky, white discharge similar to what was removed previously. Has noticed new swelling/capsule under skin for past 3-4 months, but thought it was scar tissue until it became painful and began draining yesterday.  Keflex cleared up infection last time.  Left arm/shoulder warm to touch and tender.      Review of Systems  Constitutional: Negative for activity change, appetite change, chills, diaphoresis, fatigue and fever.  HENT: Negative for trouble swallowing and voice change.   Eyes: Negative for pain and discharge.  Respiratory: Negative for cough, shortness of breath, wheezing and stridor.   Cardiovascular: Negative for chest pain.  Gastrointestinal: Negative for blood in stool, constipation, diarrhea, nausea and vomiting.  Endocrine: Negative for cold intolerance and heat intolerance.  Genitourinary: Negative for difficulty urinating, dysuria and hematuria.  Musculoskeletal: Positive for myalgias. Negative for arthralgias, back pain, gait problem, joint swelling, neck pain and neck stiffness.  Skin: Positive for rash. Negative for color change, pallor and wound.  Allergic/Immunologic: Negative for environmental allergies and food allergies.  Neurological: Negative for dizziness, tremors, seizures, syncope, facial asymmetry, speech difficulty, weakness, light-headedness, numbness and headaches.  Hematological: Negative for adenopathy. Does not bruise/bleed easily.  Psychiatric/Behavioral: Negative for agitation, confusion and sleep disturbance. The patient is not nervous/anxious.        Objective:   Physical Exam  Constitutional: She is oriented to  person, place, and time. Vital signs are normal. She appears well-developed and well-nourished. She is active and cooperative.  Non-toxic appearance. She does not appear ill. No distress.  HENT:  Head: Normocephalic and atraumatic.  Right Ear: Hearing and external ear normal.  Left Ear: Hearing and external ear normal.  Nose: Nose normal.  Mouth/Throat: Uvula is midline, oropharynx is clear and moist and mucous membranes are normal. No oropharyngeal exudate.  Eyes: Pupils are equal, round, and reactive to light. Conjunctivae, EOM and lids are normal. Right eye exhibits no discharge. Left eye exhibits no discharge.  Neck: Trachea normal, normal range of motion and phonation normal. Neck supple. No tracheal deviation present.  Cardiovascular: Normal rate, regular rhythm, S1 normal, S2 normal, normal heart sounds and intact distal pulses. Exam reveals no gallop, no distant heart sounds and no friction rub.  No murmur heard. Pulses:      Radial pulses are 2+ on the right side, and 2+ on the left side.  Pulmonary/Chest: Effort normal and breath sounds normal. No stridor. No respiratory distress. She has no decreased breath sounds. She has no wheezes. She has no rhonchi. She has no rales.  Abdominal: Soft. Normal appearance. She exhibits no distension, no fluid wave and no ascites. There is no rigidity and no guarding.  Musculoskeletal: Normal range of motion. She exhibits tenderness. She exhibits no edema or deformity.       Right shoulder: Normal.       Left shoulder: She exhibits tenderness, swelling and pain. She exhibits normal range of motion, no bony tenderness, no effusion, no crepitus, no deformity, no laceration, no spasm, normal pulse and normal strength.       Right elbow: Normal.      Left elbow: Normal.  Right hip: Normal.       Left hip: Normal.       Right knee: Normal.       Left knee: Normal.       Cervical back: Normal.       Thoracic back: Normal.       Lumbar back:  Normal.       Right upper arm: Normal.       Left upper arm: Normal.       Right forearm: Normal.       Left forearm: Normal.       Arms:      Right hand: Normal.       Left hand: Normal.  Nodule less than 1cm left deltoid nonmobile firm tender increased temperature macular erythema surrounding nodule compared to biceps and triceps  Lymphadenopathy:       Head (right side): No submental, no submandibular, no tonsillar, no preauricular, no posterior auricular and no occipital adenopathy present.       Head (left side): No submental, no submandibular, no tonsillar, no preauricular, no posterior auricular and no occipital adenopathy present.    She has no cervical adenopathy.       Right cervical: No superficial cervical, no deep cervical and no posterior cervical adenopathy present.      Left cervical: No superficial cervical, no deep cervical and no posterior cervical adenopathy present.  Neurological: She is alert and oriented to person, place, and time. She has normal strength. She is not disoriented. She displays no atrophy and no tremor. No cranial nerve deficit or sensory deficit. She exhibits normal muscle tone. She displays no seizure activity. Coordination and gait normal. GCS eye subscore is 4. GCS verbal subscore is 5. GCS motor subscore is 6.  In/out of chair; on/off exam table without difficulty; gait sure and steady in hallway  Skin: Skin is warm, dry and intact. Capillary refill takes less than 2 seconds. Rash noted. No abrasion, no bruising, no burn, no ecchymosis, no laceration, no lesion, no petechiae and no purpura noted. Rash is macular and nodular. Rash is not papular, not maculopapular, not pustular, not vesicular and not urticarial. She is not diaphoretic. There is erythema. No cyanosis. No pallor. Nails show no clubbing.     Scar adjacent to nodule less than 1 cm left shoulder see musculoskeletal exam  Psychiatric: She has a normal mood and affect. Her speech is normal and  behavior is normal. Judgment and thought content normal. She is not actively hallucinating. Cognition and memory are normal. She is attentive.  Nursing note and vitals reviewed.         Assessment & Plan:  A-cellulitis  P-keflex 500mg  po TID x 7 days #30 Rf0 dispnesed from [facility].  Tylenol 1000mg  po QID prn pain.  May apply warm compress to increase circulation to area but if worsening swelling cryotherapy 15 minutes TID prn.  Exitcare handout on skin infection and epidermal cyst.  Discussed if cellulitis reoccurs then I recommend follow up with dermatology may require repeat excision. RTC if worsening erythema, pain, purulent discharge, fever. Wash towels, washcloths, sheets in hot water with bleach every couple of days until infection resolved. Patient verbalized understanding, agreed with plan of care and had no further questions at this time.

## 2017-09-17 ENCOUNTER — Encounter (HOSPITAL_COMMUNITY): Payer: Self-pay | Admitting: Emergency Medicine

## 2017-09-17 ENCOUNTER — Emergency Department (HOSPITAL_COMMUNITY)
Admission: EM | Admit: 2017-09-17 | Discharge: 2017-09-17 | Disposition: A | Discharge status: Home / Self Care | Payer: No Typology Code available for payment source | Attending: Emergency Medicine | Admitting: Emergency Medicine

## 2017-09-17 ENCOUNTER — Other Ambulatory Visit: Payer: Self-pay

## 2017-09-17 DIAGNOSIS — K5901 Slow transit constipation: Secondary | ICD-10-CM | POA: Insufficient documentation

## 2017-09-17 DIAGNOSIS — Z79899 Other long term (current) drug therapy: Secondary | ICD-10-CM | POA: Diagnosis not present

## 2017-09-17 DIAGNOSIS — R1032 Left lower quadrant pain: Secondary | ICD-10-CM | POA: Diagnosis present

## 2017-09-17 NOTE — Discharge Instructions (Addendum)
Please take the following medications to help with your constipation   Magnesium citrate, drink 1 bottle MiraLAX, take 1 capful every 3 hours until having regular soft stools  Ultimately taking these medications will likely end up giving you diarrhea but it will also clean you out.  For your slow transit constipation you may need to follow-up with a family doctor to start a long-term constipation medication.  Return to the emergency department for increasing pain vomiting or fevers

## 2017-09-17 NOTE — ED Provider Notes (Signed)
[facility]-EMERGENCY DEPT Provider Note   CSN: [id number] Arrival date & time: 09/17/17  1648     History   Chief Complaint Chief Complaint  Patient presents with  . Constipation    HPI Tracey Gibson is a 27 y.o. female.  HPI  The patient is a 27 year old female, she has a history of ADHD anxiety and depression, on several different medications including Adderall, trazodone and Cymbalta.  She recently decreased her dose from 60 mg to 30 mg.  The patient states that for the last 10 days she has had significant constipation, this is resulting in only a small amount of stool which she describes as little pellets, she feels like she has increasing left lower quadrant discomfort but no vomiting fevers chills or difficulty urinating.  Ultimately her family doctor asked her to come to the hospital for evaluation as she has tried a couple of days of intermittent over-the-counter laxatives without relief.  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  Past Medical History:  Diagnosis Date  . ADHD   . Anxiety   . Depression     Patient Active Problem List   Diagnosis Date Noted  . Acne vulgaris 05/05/2015  . ADD (attention deficit disorder) 05/05/2015  . Anxiety 05/05/2015  . FOOT PAIN, BILATERAL 01/17/2007    Past Surgical History:  Procedure Laterality Date  . WISDOM TOOTH EXTRACTION       OB History   None      Home Medications    Prior to Admission medications   Medication Sig Start Date End Date Taking? Authorizing Provider  amphetamine-dextroamphetamine (ADDERALL) 20 MG tablet Take 20 mg by mouth daily.    [provider]  DULoxetine (CYMBALTA) 60 MG capsule  06/15/17   [provider]  PARAGARD INTRAUTERINE COPPER IU by Intrauterine route.    [provider]  traZODone (DESYREL) 100 MG tablet Take 100 mg by  mouth at bedtime.    [provider]    Family History No family history on file.  Social History Social History   Tobacco Use  . Smoking status: Never Smoker  . Smokeless tobacco: Never Used  Substance Use Topics  . Alcohol use: Yes  . Drug use: Not on file     Allergies   Patient has no known allergies.   Review of Systems Review of Systems  Constitutional: Negative for chills and fever.  Gastrointestinal: Positive for abdominal pain and constipation. Negative for nausea and vomiting.     Physical Exam Updated Vital Signs BP 128/81 (BP Location: Left Arm)   Pulse 93   Temp 99.1 F (37.3 C) (Oral)   Resp 14   Ht 5\' 7"  (1.702 m)   Wt 72.6 kg (160 lb)   LMP 08/29/2017   SpO2 99%   BMI 25.06 kg/m   Physical Exam  Constitutional: She appears well-developed and well-nourished.  HENT:  Head: Normocephalic and atraumatic.  Eyes: Conjunctivae are normal. Right eye exhibits no discharge. Left eye exhibits no discharge.  Pulmonary/Chest: Effort normal. No respiratory distress.  Abdominal: She exhibits no distension and no mass. There is tenderness ( Focal tenderness in the left lower quadrant without guarding, very soft.). There is no guarding.  Neurological: She is alert. Coordination normal.  Skin: Skin is warm and dry. No rash noted. She is not diaphoretic. No erythema.  Psychiatric: She has  a normal mood and affect.  Nursing note and vitals reviewed.    ED Treatments / Results  Labs (all labs ordered are listed, but only abnormal results are displayed) Labs Reviewed - No data to display  EKG None  Radiology No results found.  Procedures Procedures (including critical care time)  Medications Ordered in ED Medications - No data to display   Initial Impression / Assessment and Plan / ED Course  I have reviewed the triage vital signs and the nursing notes.  Pertinent labs & imaging results that were available during my care of the patient  were reviewed by me and considered in my medical decision making (see chart for details).    The patient declines a  rectal exam.  At this time it sounds very consistent with her chronic constipation which seems to be slow transit and related to dehydration.  I have encouraged aggressive hydration as well as magnesium citrate, MiraLAX and follow-up with the family doctor for long-term treatment.  Patient has a nonsurgical abdomen, doubt other etiology.  Stable for discharge, patient is totally agreeable to the plan.  Last menstrual cycle was 2 weeks ago, she has an IUD.  Final Clinical Impressions(s) / ED Diagnoses   Final diagnoses:  Slow transit constipation      Eber HongMiller, Makaya Juneau, MD 09/17/17 1942

## 2017-09-17 NOTE — ED Triage Notes (Signed)
Pt c/o constipation over 10days. Reports taking laxatives Wed and enemas with no relief. Reports hx constipation but never this bad.

## 2017-10-18 ENCOUNTER — Ambulatory Visit: Payer: No Typology Code available for payment source | Admitting: Family Medicine

## 2017-10-18 ENCOUNTER — Encounter: Payer: Self-pay | Admitting: Family Medicine

## 2017-10-18 VITALS — BP 126/66 | HR 120 | Ht 67.0 in | Wt 166.0 lb

## 2017-10-18 DIAGNOSIS — M545 Low back pain, unspecified: Secondary | ICD-10-CM

## 2017-10-18 DIAGNOSIS — G8929 Other chronic pain: Secondary | ICD-10-CM | POA: Insufficient documentation

## 2017-10-18 DIAGNOSIS — M357 Hypermobility syndrome: Secondary | ICD-10-CM

## 2017-10-18 NOTE — Assessment & Plan Note (Addendum)
This appears to be muscular in nature. May have a component of SI joint involvement. Her hypermobility is likely inhibiting the resolution of her pain.  - counseled on HEP  - can continue mobic and flexeril  - if no improvement consider PT

## 2017-10-18 NOTE — Patient Instructions (Signed)
Nice to meet you  Please try heat  Capsaicin topically up to four times a day may also help with pain. Please try the exercises  Please bring in your orthotics and we may need to add more cushioning to them.  Please follow up with me in 3-4 weeks if your pain doesn't improve.

## 2017-10-18 NOTE — Progress Notes (Signed)
Tracey SchaumannChloe S Gibson - 27 y.o. female MRN [id number]  Date of birth: 02/13/1991  SUBJECTIVE:  Including CC & ROS.  Chief Complaint  Patient presents with  . Back Pain    Devi Tracey Gibson is a 27 y.o. female that is presenting with low back pain. One month ago she was loading water bottles on to a golf cart, she noticed the pain afterwards. Pain has been increasing over there past two weeks. Located at her lower back. Denies tingling or numbness. Denies certain activities that exacerbate the pain. Pain is constant, mild to severe in nature. [facility] was prescribed flexeril and mobic, noticed some improvement. Her job requires heavy lifting which triggers some pain. Denies previous injury.    Review of Systems  Constitutional: Negative for fever.  HENT: Negative for congestion.   Respiratory: Negative for cough.   Cardiovascular: Negative for chest pain.  Gastrointestinal: Negative for abdominal pain.  Musculoskeletal: Positive for back pain.  Skin: Negative for color change.  Neurological: Negative for weakness.  Hematological: Negative for adenopathy.  Psychiatric/Behavioral: Negative for agitation.    HISTORY: Past Medical, Surgical, Social, and Family History Reviewed & Updated per EMR.   Pertinent Historical Findings include:  Past Medical History:  Diagnosis Date  . ADHD   . Anxiety   . Depression     Past Surgical History:  Procedure Laterality Date  . WISDOM TOOTH EXTRACTION      No Known Allergies  No family history on file.   Social History   Socioeconomic History  . Marital status: Single    Spouse name: Not on file  . Number of children: Not on file  . Years of education: Not on file  . Highest education level: Not on file  Occupational History  . Not on file  Social Needs  . Financial resource strain: Not on file  . Food insecurity:    Worry: Not on file    Inability: Not on file  . Transportation needs:    Medical: Not on file   Non-medical: Not on file  Tobacco Use  . Smoking status: Never Smoker  . Smokeless tobacco: Never Used  Substance and Sexual Activity  . Alcohol use: Yes  . Drug use: Not on file  . Sexual activity: Not on file  Lifestyle  . Physical activity:    Days per week: Not on file    Minutes per session: Not on file  . Stress: Not on file  Relationships  . Social connections:    Talks on phone: Not on file    Gets together: Not on file    Attends religious service: Not on file    Active member of club or organization: Not on file    Attends meetings of clubs or organizations: Not on file    Relationship status: Not on file  . Intimate partner violence:    Fear of current or ex partner: Not on file    Emotionally abused: Not on file    Physically abused: Not on file    Forced sexual activity: Not on file  Other Topics Concern  . Not on file  Social History Narrative  . Not on file     PHYSICAL EXAM:  VS: BP 126/66 (BP Location: Left Arm, Patient Position: Sitting, Cuff Size: Normal)   Pulse (!) 120   Ht 5\' 7"  (1.702 m)   Wt 166 lb (75.3 kg)   SpO2 98%   BMI 26.00 kg/m  Physical Exam Gen: NAD, alert, cooperative with exam, well-appearing ENT: normal lips, normal nasal mucosa,  Eye: normal EOM, normal conjunctiva and lids CV:  no edema, +2 pedal pulses   Resp: no accessory muscle use, non-labored,   Skin: no rashes, no areas of induration  Neuro: normal tone, normal sensation to touch Psych:  normal insight, alert and oriented MSK:  Back:  Mild TTP of the lumbar paraspinal muscles. Some TTP of the SI joints  No TTP of the GT, piriformis.  Normal IR and ER  Normal strength to resistance with hip flexion, knee flexion and extension  Negative SLR b/l  Feet:  Pes planus  Significant midtalar medial shift  Hallux valgus b/l    Beighton Score  Able to take thumb to forearm and pinky past 90 degrees Mild hyperextension of the elbows and knees  Unable to place hands flat  on the floor.      ASSESSMENT & PLAN:   Acute bilateral low back pain without sciatica This appears to be muscular in nature. May have a component of SI joint involvement. Her hypermobility is likely inhibiting the resolution of her pain.  - counseled on HEP  - can continue mobic and flexeril  - if no improvement consider PT   Hypermobility syndrome Most likely related to his pain and the changes in her feet. Is obtaining orthotics for her feet. Would consider scaphoid pads for her orthotics if they don't build enough arch.

## 2017-10-18 NOTE — Assessment & Plan Note (Signed)
Most likely related to his pain and the changes in her feet. Is obtaining orthotics for her feet. Would consider scaphoid pads for her orthotics if they don't build enough arch.

## 2017-10-19 ENCOUNTER — Telehealth: Payer: Self-pay | Admitting: *Deleted

## 2017-10-19 NOTE — Telephone Encounter (Signed)
Notified patient work note complete and can pick up at [employer] location.  Copied from CRM [id number]. Topic: General - Other >> Oct 19, 2017 12:34 PM Gerrianne ScalePayne, Angela L wrote: Reason for CRM: pt saw Riki RuskJeremy yesterday and need a note for work with her restrictions and when to return back to work pt will come in tomorrow to pick note up

## 2017-10-19 NOTE — Telephone Encounter (Signed)
Pt called in and stated that her job stated that they need a length of time pt will be on restrictions. Pt states you can fax the new note to [phone]

## 2017-10-20 ENCOUNTER — Encounter: Payer: Self-pay | Admitting: *Deleted

## 2017-10-20 NOTE — Telephone Encounter (Signed)
Left message to return call on VM.

## 2017-10-21 NOTE — Telephone Encounter (Signed)
Spoke with patient-faxed note to work-patient scheduled for follow up in two weeks.

## 2017-11-08 ENCOUNTER — Ambulatory Visit: Payer: No Typology Code available for payment source | Admitting: Family Medicine

## 2017-11-08 DIAGNOSIS — M545 Low back pain, unspecified: Secondary | ICD-10-CM

## 2017-11-08 NOTE — Progress Notes (Signed)
Tracey Gibson - 27 y.o. female MRN [id number]  Date of birth: [birth year]  SUBJECTIVE:  Including CC & ROS.  Chief Complaint  Patient presents with  . Follow-up    Tracey Gibson is a 27 y.o. female that is here today for back pain follow up. Her pain has improved. If she stands or sits for long periods triggers mild pain.  She denies radicular symptoms.  The pain is more of a dull ache now.  She notices the pain still just in the lower back.     Review of Systems  Musculoskeletal: Positive for back pain.  Neurological: Negative for weakness.  Hematological: Negative for adenopathy.  Psychiatric/Behavioral: Negative for agitation.    HISTORY: Past Medical, Surgical, Social, and Family History Reviewed & Updated per EMR.   Pertinent Historical Findings include:  Past Medical History:  Diagnosis Date  . ADHD   . Anxiety   . Depression     Past Surgical History:  Procedure Laterality Date  . WISDOM TOOTH EXTRACTION      No Known Allergies  No family history on file.   Social History   Socioeconomic History  . Marital status: Single    Spouse name: Not on file  . Number of children: Not on file  . Years of education: Not on file  . Highest education level: Not on file  Occupational History  . Not on file  Social Needs  . Financial resource strain: Not on file  . Food insecurity:    Worry: Not on file    Inability: Not on file  . Transportation needs:    Medical: Not on file    Non-medical: Not on file  Tobacco Use  . Smoking status: Never Smoker  . Smokeless tobacco: Never Used  Substance and Sexual Activity  . Alcohol use: Yes  . Drug use: Not on file  . Sexual activity: Not on file  Lifestyle  . Physical activity:    Days per week: Not on file    Minutes per session: Not on file  . Stress: Not on file  Relationships  . Social connections:    Talks on phone: Not on file    Gets together: Not on file    Attends religious service: Not on file    Active  member of club or organization: Not on file    Attends meetings of clubs or organizations: Not on file    Relationship status: Not on file  . Intimate partner violence:    Fear of current or ex partner: Not on file    Emotionally abused: Not on file    Physically abused: Not on file    Forced sexual activity: Not on file  Other Topics Concern  . Not on file  Social History Narrative  . Not on file     PHYSICAL EXAM:  VS: BP 108/66   Pulse (!) 108   Ht 5\' 7"  (1.702 m)   Wt 166 lb (75.3 kg)   SpO2 100%   BMI 26.00 kg/m  Physical Exam Gen: NAD, alert, cooperative with exam, well-appearing ENT: normal lips, normal nasal mucosa,  Eye: normal EOM, normal conjunctiva and lids CV:  no edema, +2 pedal pulses   Resp: no accessory muscle use, non-labored,  Skin: no rashes, no areas of induration  Neuro: normal tone, normal sensation to touch Psych:  normal insight, alert and oriented MSK:  Back: No tenderness to palpation of midline spine. Normal straight leg raise bilaterally. Normal  strength resistance with hip flexion. Neurovascular intact     ASSESSMENT & PLAN:   Acute bilateral low back pain without sciatica Pain has improved with interventions thus far. -Counseled on home exercise therapy -Provided a work note with limitations and follow-up -If no improvement consider imaging and trigger point injections or physical therapy.

## 2017-11-08 NOTE — Assessment & Plan Note (Signed)
Pain has improved with interventions thus far. -Counseled on home exercise therapy -Provided a work note with limitations and follow-up -If no improvement consider imaging and trigger point injections or physical therapy.

## 2017-11-08 NOTE — Patient Instructions (Addendum)
Good to see you  Please continue to try the exercises  Please see me back in 3 weeks.

## 2017-11-29 ENCOUNTER — Ambulatory Visit: Payer: No Typology Code available for payment source | Admitting: Family Medicine

## 2017-11-29 NOTE — Progress Notes (Deleted)
  Tracey Gibson - 27 y.o. female MRN [id number]  Date of birth: November 01, 1990  SUBJECTIVE:  Including CC & ROS.  No chief complaint on file.   Ranay ARNETT Gibson is a 27 y.o. female that is  ***.  ***   Review of Systems  HISTORY: Past Medical, Surgical, Social, and Family History Reviewed & Updated per EMR.   Pertinent Historical Findings include:  Past Medical History:  Diagnosis Date  . ADHD   . Anxiety   . Depression     Past Surgical History:  Procedure Laterality Date  . WISDOM TOOTH EXTRACTION      No Known Allergies  No family history on file.   Social History   Socioeconomic History  . Marital status: Single    Spouse name: Not on file  . Number of children: Not on file  . Years of education: Not on file  . Highest education level: Not on file  Occupational History  . Not on file  Social Needs  . Financial resource strain: Not on file  . Food insecurity:    Worry: Not on file    Inability: Not on file  . Transportation needs:    Medical: Not on file    Non-medical: Not on file  Tobacco Use  . Smoking status: Never Smoker  . Smokeless tobacco: Never Used  Substance and Sexual Activity  . Alcohol use: Yes  . Drug use: Not on file  . Sexual activity: Not on file  Lifestyle  . Physical activity:    Days per week: Not on file    Minutes per session: Not on file  . Stress: Not on file  Relationships  . Social connections:    Talks on phone: Not on file    Gets together: Not on file    Attends religious service: Not on file    Active member of club or organization: Not on file    Attends meetings of clubs or organizations: Not on file    Relationship status: Not on file  . Intimate partner violence:    Fear of current or ex partner: Not on file    Emotionally abused: Not on file    Physically abused: Not on file    Forced sexual activity: Not on file  Other Topics Concern  . Not on file  Social History Narrative  . Not on file     PHYSICAL  EXAM:  VS: There were no vitals taken for this visit. Physical Exam Gen: NAD, alert, cooperative with exam, well-appearing ENT: normal lips, normal nasal mucosa,  Eye: normal EOM, normal conjunctiva and lids CV:  no edema, +2 pedal pulses   Resp: no accessory muscle use, non-labored,  GI: no masses or tenderness, no hernia  Skin: no rashes, no areas of induration  Neuro: normal tone, normal sensation to touch Psych:  normal insight, alert and oriented MSK:  ***      ASSESSMENT & PLAN:   No problem-specific Assessment & Plan notes found for this encounter.

## 2017-12-02 ENCOUNTER — Telehealth: Payer: Self-pay | Admitting: Psychiatry

## 2017-12-02 NOTE — Telephone Encounter (Signed)
Will get chart for you  Thanks

## 2017-12-02 NOTE — Telephone Encounter (Signed)
Phone call about rapid heart rate of 144 bpm sitting in class yesterday according to her fit bit.  She asks if this is anxiety but she does not acknowledge any immediate reason for excessive anxiety at that time.  She continues the Adderall which can accelerate heart rate.  She knows of no family or personal past history of cardiac disease except late life heart failure in the extended family.  As her attempts to correlate cause-and-effect proceed, we will hold the Cymbalta reduced from 60 to 30 mg in July here for over activation symptoms to assess for improvement in heart rate over the next 3 days and decide about treatment structure then.  She understands management of any discontinuation symptoms in the interim.

## 2017-12-02 NOTE — Telephone Encounter (Signed)
CONCERNED WITH SIDE EFFECTS OF CYMBALTA.  HER HEAT REATE IS ALL OVER THE PLACE. RACING AT REST BPM HAS BEEN AS HIGH AS 144.  YESTERDAY IT WAS 134 FOR 15 MINUTES.

## 2017-12-05 ENCOUNTER — Ambulatory Visit: Payer: PRIVATE HEALTH INSURANCE | Admitting: Psychiatry

## 2017-12-05 ENCOUNTER — Encounter: Payer: Self-pay | Admitting: Psychiatry

## 2017-12-05 VITALS — Ht 69.0 in | Wt 164.0 lb

## 2017-12-05 DIAGNOSIS — F900 Attention-deficit hyperactivity disorder, predominantly inattentive type: Secondary | ICD-10-CM

## 2017-12-05 DIAGNOSIS — F4312 Post-traumatic stress disorder, chronic: Secondary | ICD-10-CM

## 2017-12-05 DIAGNOSIS — F422 Mixed obsessional thoughts and acts: Secondary | ICD-10-CM | POA: Diagnosis not present

## 2017-12-05 DIAGNOSIS — F429 Obsessive-compulsive disorder, unspecified: Secondary | ICD-10-CM | POA: Insufficient documentation

## 2017-12-05 DIAGNOSIS — F341 Dysthymic disorder: Secondary | ICD-10-CM | POA: Diagnosis not present

## 2017-12-05 MED ORDER — AMPHETAMINE-DEXTROAMPHETAMINE 20 MG PO TABS
20.0000 mg | ORAL_TABLET | Freq: Every day | ORAL | 0 refills | Status: DC
Start: 2018-01-04 — End: 2017-12-06

## 2017-12-05 MED ORDER — TRAZODONE HCL 100 MG PO TABS: 100.0000 mg | ORAL_TABLET | Freq: Every day | ORAL | 2 refills | Status: DC

## 2017-12-05 MED ORDER — AMPHETAMINE-DEXTROAMPHETAMINE 20 MG PO TABS: 20.0000 mg | ORAL_TABLET | Freq: Every day | ORAL | 0 refills | Status: DC

## 2017-12-05 MED ORDER — FLUVOXAMINE MALEATE 100 MG PO TABS: 100.0000 mg | ORAL_TABLET | Freq: Every day | ORAL | 2 refills | Status: DC

## 2017-12-05 NOTE — Progress Notes (Signed)
[facility]  Patient ID: Tracey Gibson,  MRN: [id number]  PCP: Patient, No Pcp Per  Date of Evaluation: 12/05/2017 Time spent:20 minutes   HISTORY/CURRENT STATUS: HPI  Individual Medical History/ Review of Systems: Changes? :Yes.  Tracey Gibson is seen individually face-to-face with consent on collateral last week stopping duloxetine in the interim for tachycardia 144 at rest by fit bit in class for psychiatric interview and exam in 68-month evaluation and management of ADHD/OCD, PTSD, and dysthymia.  Boyfriend with whom she resides is most concerned about her pulling bikini hair with tweezers though not her eyebrows this time.  She is clenching her jaw at times.  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  She is less activated and has less tachycardia off of Luvox having reduced the dose at last appointment 3 months ago from 60 to 30 mg for her over activation.  She still has posttraumatic anxiety needing her boyfriend to go to dinner with her family so that she could go without panic.  As we review previous medications, current trazodone is helping sleep adequately.  She has stopped Adderall for last 4 days to assess heart rate with no significant difference but finds a need for Adderall for school.  She therefore seeks to resume her Adderall and to change the previous Cymbalta.  Allergies: Patient has no known allergies.  Current Medications:  Current Outpatient Medications:  .  amphetamine-dextroamphetamine (ADDERALL) 20 MG tablet, Take 1 tablet (20 mg total) by mouth daily., Disp: 30 tablet, Rfl: 0 .  [START ON 01/04/2018] amphetamine-dextroamphetamine (ADDERALL) 20 MG tablet, Take 1 tablet (20 mg total) by mouth daily., Disp: 30 tablet, Rfl: 0 .  [START ON 02/03/2018] amphetamine-dextroamphetamine  (ADDERALL) 20 MG tablet, Take 1 tablet (20 mg total) by mouth daily., Disp: 30 tablet, Rfl: 0 .  cyclobenzaprine (FLEXERIL) 10 MG tablet, Take 10 mg by mouth 3 (three) times daily., Disp: , Rfl: 0 .  fluvoxaMINE (LUVOX) 100 MG tablet, Take 1 tablet (100 mg total) by mouth at bedtime., Disp: 30 tablet, Rfl: 2 .  meloxicam (MOBIC) 15 MG tablet, TAKE 1 TABLET EVERY DAY WITH FOOD, Disp: , Rfl: 0 .  PARAGARD INTRAUTERINE COPPER IU, by Intrauterine route., Disp: , Rfl:  .  traMADol (ULTRAM) 50 MG tablet, TAKE 1 TABLET TWICE A DAY AS NEEDED FOR PAIN, Disp: , Rfl: 0 .  traZODone (DESYREL) 100 MG tablet, Take 1 tablet (100 mg total) by mouth at bedtime., Disp: 30 tablet, Rfl: 2 Medication Side Effects: Other: Previous over activation and more recently tachycardic no personal or family history of cardiac disorder except late age heart failure in extended relatives.  Family Medical/ Social History: Changes? No  MENTAL HEALTH EXAM:  Height 5\' 9"  (1.753 m), weight 164 lb (74.4 kg), last menstrual period 12/04/2017.Body mass index is 24.22 kg/m.  General Appearance: Casual  Eye Contact:  Good  Speech:  Clear and Coherent  Volume:  Normal  Mood:  Anxious and Dysphoric  Affect:  Inappropriate and Restricted  Thought Process:  Goal Directed  Orientation:  Full (Time, Place, and Person)  Thought Content: Obsessions   Suicidal Thoughts:  No  Homicidal Thoughts:  No  Memory:  Recent  Judgement:  Fair  Insight:  Fair  Psychomotor Activity:  Normal and Restlessness  Concentration:  Concentration: Fair and Attention Span: Poor  Recall:  Good  Fund of Knowledge: Good  Language: Good  Akathisia:  No  AIMS (if indicated): done = 0  Assets:  Desire for Improvement Talents/Skills  ADL's:  Intact  Cognition: WNL  Prognosis:  Fair    DIAGNOSES:    ICD-10-CM   1. 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 Mixed obsessional thoughts and acts F42.2 traZODone (DESYREL) 100 MG tablet    fluvoxaMINE (LUVOX) 100 MG tablet  4. Chronic post-traumatic stress disorder F43.12 traZODone (DESYREL) 100 MG tablet    fluvoxaMINE (LUVOX) 100 MG tablet    RECOMMENDATIONS: Charita allows clarification of various treatment targets with anxiety and compulsivity or obsessionality foremost.  Posttraumatic anxiety is modest to moderate situational.  Depression is modestly increased.  She needs to restart Adderall 20 mg IR every morning sent as a 30-day supply for the subsequent 3 months.  She continues trazodone 100 mg nightly as a month supply and 2 refills.  As she is off duloxetine we will start fluvoxamine 100 mg tablet to titrate over 10 days up to 1 tablet nightly #30 with 2 refills to return in 6 weeks as she manages her back, job, and school as possible.    Chauncey Mann, MD

## 2017-12-05 NOTE — Patient Instructions (Signed)
Take one half of the Luvox tablet nightly for 10 days then advance to 1 tablet nightly.

## 2017-12-06 ENCOUNTER — Ambulatory Visit: Payer: No Typology Code available for payment source | Admitting: Family Medicine

## 2017-12-06 ENCOUNTER — Encounter: Payer: Self-pay | Admitting: Family Medicine

## 2017-12-06 ENCOUNTER — Ambulatory Visit (INDEPENDENT_AMBULATORY_CARE_PROVIDER_SITE_OTHER): Payer: No Typology Code available for payment source

## 2017-12-06 VITALS — BP 128/74 | HR 88 | Ht 69.0 in

## 2017-12-06 DIAGNOSIS — M545 Low back pain, unspecified: Secondary | ICD-10-CM

## 2017-12-06 NOTE — Patient Instructions (Signed)
Good to see you  You will get a call for physical therapy  I will call about the results from today

## 2017-12-06 NOTE — Progress Notes (Signed)
Tracey Gibson - 27 y.o. female MRN [id number]  Date of birth: Mar 29, 1990  SUBJECTIVE:  Including CC & ROS.  Chief Complaint  Patient presents with  . Follow-up    Tracey Gibson is a 27 y.o. female that is here today for back pain follow up. She feels like her symptoms have stayed the same. She has been limiting her standing at work. She has not been completing daily exercises. She notices the pain increased after activity.     Review of Systems  Constitutional: Negative for fever.  HENT: Negative for congestion.   Respiratory: Negative for cough.   Cardiovascular: Negative for chest pain.  Gastrointestinal: Negative for abdominal pain.  Musculoskeletal: Positive for back pain.  Skin: Negative for color change.  Neurological: Negative for weakness.  Hematological: Negative for adenopathy.  Psychiatric/Behavioral: Negative for agitation.    HISTORY: Past Medical, Surgical, Social, and Family History Reviewed & Updated per EMR.   Pertinent Historical Findings include:  Past Medical History:  Diagnosis Date  . ADHD   . Anxiety   . Depression     Past Surgical History:  Procedure Laterality Date  . WISDOM TOOTH EXTRACTION      No Known Allergies  Family History  Problem Relation Age of Onset  . Alcoholism Maternal Grandmother   . Suicidality Maternal Grandfather   . ADD / ADHD Maternal Aunt   . Bipolar disorder Maternal Aunt   . Alcohol abuse Maternal Aunt      Social History   Socioeconomic History  . Marital status: Single    Spouse name: Not on file  . Number of children: Not on file  . Years of education: Not on file  . Highest education level: Not on file  Occupational History  . Not on file  Social Needs  . Financial resource strain: Not on file  . Food insecurity:    Worry: Not on file    Inability: Not on file  . Transportation needs:    Medical: Not on file    Non-medical: Not on file  Tobacco Use  . Smoking status: Never Smoker  . Smokeless  tobacco: Never Used  Substance and Sexual Activity  . Alcohol use: Yes  . Drug use: Not on file  . Sexual activity: Not on file  Lifestyle  . Physical activity:    Days per week: Not on file    Minutes per session: Not on file  . Stress: Not on file  Relationships  . Social connections:    Talks on phone: Not on file    Gets together: Not on file    Attends religious service: Not on file    Active member of club or organization: Not on file    Attends meetings of clubs or organizations: Not on file    Relationship status: Not on file  . Intimate partner violence:    Fear of current or ex partner: Not on file    Emotionally abused: Not on file    Physically abused: Not on file    Forced sexual activity: Not on file  Other Topics Concern  . Not on file  Social History Narrative  . Not on file     PHYSICAL EXAM:  VS: BP 128/74   Pulse 88   Ht 5\' 9"  (1.753 m)   LMP 12/04/2017 (Approximate) Comment: IUD  SpO2 98%   BMI 24.22 kg/m  Physical Exam Gen: NAD, alert, cooperative with exam, well-appearing ENT: normal lips, normal nasal mucosa,  Eye: normal EOM, normal conjunctiva and lids CV:  no edema, +2 pedal pulses   Resp: no accessory muscle use, non-labored,  Skin: no rashes, no areas of induration  Neuro: normal tone, normal sensation to touch Psych:  normal insight, alert and oriented MSK:  Back:  Mild TTP of the lower bilateral lumbar muscles  Normal hip IR and ER  Normal hip flexion strength to resistance  Neurovascularly intact      ASSESSMENT & PLAN:   Acute bilateral low back pain without sciatica Pain has been ongoing. She has had her anxiety medications changed recently and feels that may be contributing.  - referral to PT  - lumbar films  - if no improvement consider MRI

## 2017-12-08 NOTE — Assessment & Plan Note (Signed)
Pain has been ongoing. She has had her anxiety medications changed recently and feels that may be contributing.  - referral to PT  - lumbar films  - if no improvement consider MRI

## 2017-12-19 ENCOUNTER — Other Ambulatory Visit: Payer: Self-pay

## 2017-12-19 ENCOUNTER — Ambulatory Visit: Payer: No Typology Code available for payment source | Attending: Family Medicine | Admitting: Physical Therapy

## 2017-12-19 DIAGNOSIS — M5442 Lumbago with sciatica, left side: Secondary | ICD-10-CM | POA: Insufficient documentation

## 2017-12-19 NOTE — Patient Instructions (Signed)
Lower abdominal/core stability exercises  1. Practice your breathing technique: Inhale through your nose expanding your belly and rib cage. Try not to breathe into your chest. Exhale slowly and gradually out your mouth feeling a sense of softness to your body. Practice multiple times. This can be performed unlimited.  2. Finding the lower abdominals. Laying on your back with the knees bent, place your fingers just below your belly button. Using your breathing technique from above, on your exhale gently pull the belly button away from your fingertips without tensing any other muscles. Practice this 5x. Next, as you exhale, draw belly button inwards and hold onto it...then feel as if you are pulling that muscle across your pelvis like you are tightening a belt. This can be hard to do at first so be patient and practice. Do 5-10 reps 1-3 x day. Always recognize quality over quantity; if your abdominal muscles become tired you will notice you may tighten/contract other muscles. This is the time to take a break.   Practice this first laying on your back, then in sitting, progressing to standing and finally adding it to all your daily movements.      3. Single leg slides: Inhale while you slowly slide one leg out keeping your pelvis still. Only slide your leg as far as you can keep your pelvis still. Exhale as you bring the leg back to the start, contracting the lower abdominals as you do that. Keep your upper body relaxed. Do 5-10 on each side.    Deep Squat    Squat and lift with both arms held against upper trunk. Tighten stomach muscles without holding breath. Use smooth movements to avoid jerking.  Copyright  VHI. All rights reserved.     Solon Palm, PT 12/19/17 9:29 AM  [facility] [address] [place], [place], [address] Phone: [phone]   Fax:  [phone]

## 2017-12-19 NOTE — Therapy (Signed)
[facility] [address] [place], [place], [address] Phone: [phone]   Fax:  [phone]  Physical Therapy Evaluation  Patient Details  Name: Tracey Gibson MRN: [id number] Date of Birth: 1990-05-25 Referring Provider (PT): Clare Gandy   Encounter Date: 12/19/2017  PT End of Session - 12/19/17 0847    Visit Number  1    Date for PT Re-Evaluation  02/13/18    PT Start Time  0848    PT Stop Time  0930    PT Time Calculation (min)  42 min    Activity Tolerance  Patient tolerated treatment well    Behavior During Therapy  [facility] for tasks assessed/performed       Past Medical History:  Diagnosis Date  . ADHD   . Anxiety   . Depression     Past Surgical History:  Procedure Laterality Date  . WISDOM TOOTH EXTRACTION      There were no vitals filed for this visit.   Subjective Assessment - 12/19/17 0902    Subjective  Patient lifted a case of water on 09/29/17 and felt pain in her back. She worked for a week and then had a massage. After that she couldn't move for 4 days. She was off work for 2 weeks and then slight duty for 6 weeks. Now her pain is intermittent.    Pertinent History  anxiety, depression    Limitations  Standing;Walking    How long can you stand comfortably?  > 1 hour    How long can you walk comfortably?  1 hour    Diagnostic tests  xrays    Patient Stated Goals  get rid of pain    Currently in Pain?  Yes    Pain Score  3    5/10 at worst now   Pain Location  Back    Pain Orientation  Left    Pain Descriptors / Indicators  Aching;Sharp   mid left side aches at EOD; low back always sharp   Pain Type  Acute pain    Pain Radiating Towards  occassional into left gluteals    Pain Onset  More than a month ago    Pain Frequency  Intermittent    Aggravating Factors   walking    Pain Relieving Factors  lying on her back with legs elevated    Effect of Pain on Daily Activities  pain with work  and walkiing/hiking         [facility] PT Assessment - 12/19/17 0001      Assessment   Medical Diagnosis  acute bil LBP    Referring Provider (PT)  Clare Gandy    Onset Date/Surgical Date  09/29/17    Hand Dominance  Right      Precautions   Precautions  None      Balance Screen   Has the patient fallen in the past 6 months  No    Has the patient had a decrease in activity level because of a fear of falling?   No    Is the patient reluctant to leave their home because of a fear of falling?   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    Leisure  normal recreation       Observation/Other Assessments   Focus on Therapeutic Outcomes (FOTO)   46% limited      Posture/Postural Control   Posture/Postural Control  Postural limitations    Postural Limitations  Rounded Shoulders;Forward head;Increased lumbar lordosis      ROM / Strength   AROM / PROM / Strength  AROM;Strength      AROM   Overall AROM Comments  lumbar WFL, flexion limited by HS tightness; tightness with R rotation      Strength   Overall Strength Comments  grossly 5/5 in BLE; weakness in left lumbar with MMT of R hip flexors      Flexibility   Soft Tissue Assessment /Muscle Length  yes    Hamstrings  L tightness    Piriformis  L tightness      Palpation   Spinal mobility  WNL no pain      Special Tests    Special Tests  Lumbar    Lumbar Tests  Slump Test;Prone Knee Bend Test;Straight Leg Raise      Slump test   Findings  Positive    Side  Left      Prone Knee Bend Test   Findings  Negative    Side  Left    Comment  bil      Straight Leg Raise   Findings  Negative    Side   Left                Objective measurements completed on examination: See above findings.               PT Education - 12/19/17 [phone]    Education Details  HEP; basic ADL modifications (sitting, at sink; log roll); basic lifting review     Person(s) Educated  Patient    Methods  Explanation;Demonstration;Handout    Comprehension  Verbalized understanding;Returned demonstration       PT Short Term Goals - 12/19/17 0945      PT SHORT TERM GOAL #1   Title  Ind with initial HEP    Time  2    Period  Weeks    Status  New    Target Date  01/02/18      PT SHORT TERM GOAL #2   Title  Patient able to demonstrate correct body mechanics with lifting and carrying 25#    Time  4    Period  Weeks    Status  New    Target Date  01/16/18        PT Long Term Goals - 12/19/17 0946      PT LONG TERM GOAL #1   Title  Patient able to perform ADLS without pain in the low back 90% of the time.    Time  8    Period  Weeks    Status  New    Target Date  02/13/18      PT LONG TERM GOAL #2   Title  Patient able to lift 25# with at most 1/10 pain in the low back    Time  8    Status  New      PT LONG TERM GOAL #3   Title  Patient to demonstrate 5/5 Left hip flex and ext with good lumbar stabilization    Time  8    Period  Weeks    Status  New  Plan - 12/19/17 0933    Clinical Impression Statement  Patient presents with acute onset LBP beginning 09/29/17 when she lifted a case of water. She now has intermittent LBP with occassional referred pain into the left gluteals. She has tightness in her left gluteals and HS and weakness in her lumbar stabillizers and transverse abdonminus. Patient is required to lift 25# regularly at work which hurts as does bed mobility and walking.     History and Personal Factors relevant to plan of care:  anxiety, depression    Clinical Presentation  Stable    Clinical Decision Making  Low    Rehab Potential  Excellent    PT Frequency  2x / week    PT Duration  8 weeks    PT Treatment/Interventions  ADLs/Self Care Home  Management;Cryotherapy;Electrical Stimulation;Moist Heat;Traction;Ultrasound;Therapeutic exercise;Therapeutic activities;Neuromuscular re-education;Patient/family education;Manual techniques;Dry needling    PT Next Visit Plan  core/lumbar stab; body mechanics/ADL modifications; flexibility LLE    PT Home Exercise Plan  TA contraction with heel slides    Consulted and Agree with Plan of Care  Patient       Patient will benefit from skilled therapeutic intervention in order to improve the following deficits and impairments:  Pain, Decreased strength, Postural dysfunction, Impaired flexibility, Increased muscle spasms  Visit Diagnosis: Acute bilateral low back pain with left-sided sciatica - Plan: PT plan of care cert/re-cert     Problem List Patient Active Problem List   Diagnosis Date Noted  . Obsessive compulsive disorder 12/05/2017  . Chronic post-traumatic stress disorder 12/05/2017  . 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 Acute bilateral low back pain without sciatica 10/18/2017  . Hypermobility syndrome 10/18/2017  . Acne vulgaris 05/05/2015  . Attention deficit hyperactivity disorder (ADHD), inattentive type, moderate 05/05/2015  . FOOT PAIN, BILATERAL 01/17/2007    Porschea Borys PT 12/19/2017, 11:47 AM  [facility] [place] [facility] [address] [place], [place], [address] Phone: [phone]   Fax:  [phone]  Name: Tracey Gibson MRN: [id number] Date of Birth: Sep 30, 1990

## 2017-12-20 ENCOUNTER — Telehealth: Payer: Self-pay

## 2017-12-20 NOTE — Telephone Encounter (Signed)
Copied from CRM [id number]. Topic: Quick Communication - See Telephone Encounter >> Dec 20, 2017  4:06 PM Herby Abraham C wrote: CRM for notification. See Telephone encounter for: 12/20/17.  Pt's employer is calling in to see if pt has current work restrictions? They would like to stay in compliance with what the provider is requesting.  CB: [phone] ext [phone] - [address] [place]   Fax: [phone]

## 2017-12-23 NOTE — Telephone Encounter (Signed)
Spoken with Star-provided information-patient started PT on 12/19/17.

## 2017-12-26 ENCOUNTER — Ambulatory Visit: Payer: No Typology Code available for payment source | Admitting: Physical Therapy

## 2017-12-29 ENCOUNTER — Encounter: Payer: Self-pay | Admitting: Physical Therapy

## 2018-01-01 ENCOUNTER — Encounter: Payer: Self-pay | Admitting: Emergency Medicine

## 2018-01-02 ENCOUNTER — Encounter: Payer: Self-pay | Admitting: Physical Therapy

## 2018-01-02 ENCOUNTER — Ambulatory Visit: Payer: No Typology Code available for payment source | Attending: Family Medicine | Admitting: Physical Therapy

## 2018-01-02 DIAGNOSIS — M5442 Lumbago with sciatica, left side: Secondary | ICD-10-CM | POA: Insufficient documentation

## 2018-01-02 NOTE — Therapy (Signed)
[facility] [address] [place], [place], [address] Phone: [phone]   Fax:  [phone]  Physical Therapy Treatment  Patient Details  Name: Tracey Gibson MRN: [id number] Date of Birth: 03-06-1990 Referring Provider (PT): Clare Gandy   Encounter Date: 01/02/2018  PT End of Session - 01/02/18 1137    Visit Number  2    Date for PT Re-Evaluation  02/13/18    PT Start Time  1056    PT Stop Time  1137    PT Time Calculation (min)  41 min    Activity Tolerance  Patient tolerated treatment well    Behavior During Therapy  [facility] for tasks assessed/performed       Past Medical History:  Diagnosis Date  . ADHD   . Anxiety   . Depression     Past Surgical History:  Procedure Laterality Date  . WISDOM TOOTH EXTRACTION      There were no vitals filed for this visit.  Subjective Assessment - 01/02/18 1058    Subjective  "Not too bad, Just got back from vacation so I was relaxing"     Currently in Pain?  Yes    Pain Score  1     Pain Location  Back    Pain Orientation  Lower                       [facility] Adult PT Treatment/Exercise - 01/02/18 0001      Exercises   Exercises  Lumbar      Lumbar Exercises: Stretches   Passive Hamstring Stretch  Left;Right;4 reps;10 seconds    Single Knee to Chest Stretch  Left;Right;4 reps;10 seconds    Piriformis Stretch  Left;Right;4 reps;10 seconds      Lumbar Exercises: Aerobic   Nustep  L4 x 6 min       Lumbar Exercises: Machines for Strengthening   Other Lumbar Machine Exercise  Rows & lats 20lb 2x10       Lumbar Exercises: Standing   Other Standing Lumbar Exercises  Box lifts 5lb x5      Lumbar Exercises: Supine   Ab Set  10 reps;3 seconds   with Pball    Bridge  Compliant;10 reps;2 seconds;20 reps    Other Supine Lumbar Exercises  Pball bridges, K2C, Oblq               PT Short Term Goals - 12/19/17 0945      PT SHORT TERM GOAL #1    Title  Ind with initial HEP    Time  2    Period  Weeks    Status  New    Target Date  01/02/18      PT SHORT TERM GOAL #2   Title  Patient able to demonstrate correct body mechanics with lifting and carrying 25#    Time  4    Period  Weeks    Status  New    Target Date  01/16/18        PT Long Term Goals - 12/19/17 0946      PT LONG TERM GOAL #1   Title  Patient able to perform ADLS without pain in the low back 90% of the time.    Time  8    Period  Weeks    Status  New    Target Date  02/13/18      PT LONG TERM GOAL #  2   Title  Patient able to lift 25# with at most 1/10 pain in the low back    Time  8    Status  New      PT LONG TERM GOAL #3   Title  Patient to demonstrate 5/5 Left hip flex and ext with good lumbar stabilization    Time  8    Period  Weeks    Status  New            Plan - 01/02/18 1137    Clinical Impression Statement  Pt tolerated an initial progression toe TE well. She did report some initial tightness with supine bridges on the first set. Good Le flexibility overall with he stretches, HS were a little tight. Cues for core contraction with ab sets. Good posture with seated rows and lats. Instructed pt on proper lifting techniques..     Rehab Potential  Excellent    PT Frequency  2x / week    PT Duration  8 weeks    PT Treatment/Interventions  ADLs/Self Care Home Management;Cryotherapy;Electrical Stimulation;Moist Heat;Traction;Ultrasound;Therapeutic exercise;Therapeutic activities;Neuromuscular re-education;Patient/family education;Manual techniques;Dry needling    PT Next Visit Plan  core/lumbar stab; body mechanics/ADL modifications; flexibility LLE       Patient will benefit from skilled therapeutic intervention in order to improve the following deficits and impairments:  Pain, Decreased strength, Postural dysfunction, Impaired flexibility, Increased muscle spasms  Visit Diagnosis: Acute bilateral low back pain with left-sided  sciatica     Problem List Patient Active Problem List   Diagnosis Date Noted  . Obsessive compulsive disorder 12/05/2017  . Chronic post-traumatic stress disorder 12/05/2017  . 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 Acute bilateral low back pain without sciatica 10/18/2017  . Hypermobility syndrome 10/18/2017  . Acne vulgaris 05/05/2015  . Attention deficit hyperactivity disorder (ADHD), inattentive type, moderate 05/05/2015  . FOOT PAIN, BILATERAL 01/17/2007    Grayce Sessions, PTA 01/02/2018, 11:42 AM  [facility] [address] [place], [place], [address] Phone: [phone]   Fax:  [phone]  Name: RENATE DANH MRN: [id number] Date of Birth: 05-17-1990

## 2018-01-05 ENCOUNTER — Ambulatory Visit: Payer: No Typology Code available for payment source | Admitting: Physical Therapy

## 2018-01-05 DIAGNOSIS — M5442 Lumbago with sciatica, left side: Secondary | ICD-10-CM | POA: Diagnosis not present

## 2018-01-05 NOTE — Therapy (Addendum)
[facility] [place] [address] [place], [place], [address] Phone: [phone]   Fax:  [phone]  Physical Therapy Treatment  Patient Details  Name: Tracey Gibson MRN: [id number] Date of Birth: 12/31/1990 Referring Provider (PT): Clearance Coots   Encounter Date: 01/05/2018  PT End of Session - 01/05/18 0848    Visit Number  3    Date for PT Re-Evaluation  02/13/18    PT Start Time  0800    PT Stop Time  0845    PT Time Calculation (min)  45 min    Activity Tolerance  Patient tolerated treatment well    Behavior During Therapy  [facility] for tasks assessed/performed       Past Medical History:  Diagnosis Date  . ADHD   . Anxiety   . Depression     Past Surgical History:  Procedure Laterality Date  . WISDOM TOOTH EXTRACTION      There were no vitals filed for this visit.  Subjective Assessment - 01/05/18 0800    Subjective  "My back has bothered me a little bit, I think I slept on it wrong"    Currently in Pain?  Yes    Pain Score  2     Pain Orientation  Lower                       [facility] Adult PT Treatment/Exercise - 01/05/18 0001      Exercises   Exercises  Lumbar      Lumbar Exercises: Stretches   Passive Hamstring Stretch  Left;Right;2 reps;30 seconds    Single Knee to Chest Stretch  Left;Right;2 reps;30 seconds    Piriformis Stretch  3 reps;30 seconds      Lumbar Exercises: Aerobic   Nustep  L4 x 6 min       Lumbar Exercises: Machines for Strengthening   Other Lumbar Machine Exercise  Rows & lats 25lb 2x10       Lumbar Exercises: Standing   Other Standing Lumbar Exercises  Hip 3 way (red Tband)   2x10   Other Standing Lumbar Exercises  Box Lifts 8 lbs x 5      Lumbar Exercises: Seated   Other Seated Lumbar Exercises  Squats, 10 pound DB at chest, 2x15      Lumbar Exercises: Supine   Bridge  10 reps;3 seconds   Squeeze ball, marching, 2x10     Lumbar Exercises: Quadruped   Straight Leg Raise  20 reps;2 seconds   1x10   Opposite Arm/Leg Raise  20 reps;2 seconds             PT Education - 01/05/18 0847    Education Details  Pt instructed on keeping box close to body & having a wide base of support when picking objects up    Person(s) Educated  Patient    Methods  Explanation;Demonstration    Comprehension  Verbalized understanding       PT Short Term Goals - 01/05/18 0850      PT SHORT TERM GOAL #1   Title  Ind with initial HEP    Time  2    Period  Weeks    Status  Achieved        PT Long Term Goals - 12/19/17 0946      PT LONG TERM GOAL #1   Title  Patient able to perform ADLS without pain in the low back 90%  of the time.    Time  8    Period  Weeks    Status  New    Target Date  02/13/18      PT LONG TERM GOAL #2   Title  Patient able to lift 25# with at most 1/10 pain in the low back    Time  8    Status  New      PT LONG TERM GOAL #3   Title  Patient to demonstrate 5/5 Left hip flex and ext with good lumbar stabilization    Time  8    Period  Weeks    Status  New            Plan - 01/05/18 0848    Clinical Impression Statement  Pt tolerated progression of weighted exercises well in todays session. Pt needed minimal VC to sit back on heels with squats and with box pick ups. Pt is making good progress towards all goals at this time.     Rehab Potential  Excellent    PT Frequency  2x / week    PT Duration  8 weeks    PT Treatment/Interventions  ADLs/Self Care Home Management;Cryotherapy;Electrical Stimulation;Moist Heat;Traction;Ultrasound;Therapeutic exercise;Therapeutic activities;Neuromuscular re-education;Patient/family education;Manual techniques;Dry needling    PT Next Visit Plan  Progress with functional strength and core ex       Patient will benefit from skilled therapeutic intervention in order to improve the following deficits and impairments:  Pain, Decreased strength, Postural dysfunction, Impaired  flexibility, Increased muscle spasms  Visit Diagnosis: Acute bilateral low back pain with left-sided sciatica     Problem List Patient Active Problem List   Diagnosis Date Noted  . Obsessive compulsive disorder 12/05/2017  . Chronic post-traumatic stress disorder 12/05/2017  . 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 Acute bilateral low back pain without sciatica 10/18/2017  . Hypermobility syndrome 10/18/2017  . Acne vulgaris 05/05/2015  . Attention deficit hyperactivity disorder (ADHD), inattentive type, moderate 05/05/2015  . FOOT PAIN, BILATERAL 01/17/2007    Plan: Patient agrees to discharge.  Patient goals were not met. Patient is being discharged due to not returning since the last visit.  ?????      Etta Quill 01/05/2018, 8:51 AM  [address] [place], [place], [address] Phone: [phone]   Fax:  [phone]  Name: Tracey Gibson MRN: [id number] Date of Birth: 19-Jan-1991

## 2018-01-09 ENCOUNTER — Ambulatory Visit: Payer: No Typology Code available for payment source | Admitting: Physical Therapy

## 2018-01-12 ENCOUNTER — Ambulatory Visit: Payer: No Typology Code available for payment source

## 2018-01-18 ENCOUNTER — Ambulatory Visit: Payer: PRIVATE HEALTH INSURANCE | Admitting: Psychiatry

## 2018-01-19 ENCOUNTER — Ambulatory Visit: Payer: Self-pay | Admitting: Family Medicine

## 2018-01-23 ENCOUNTER — Ambulatory Visit: Payer: PRIVATE HEALTH INSURANCE | Admitting: Psychiatry

## 2018-01-26 ENCOUNTER — Ambulatory Visit: Payer: Self-pay | Admitting: Family Medicine

## 2018-01-30 ENCOUNTER — Encounter: Payer: Self-pay | Admitting: Family Medicine

## 2018-01-30 ENCOUNTER — Ambulatory Visit (INDEPENDENT_AMBULATORY_CARE_PROVIDER_SITE_OTHER): Payer: No Typology Code available for payment source | Admitting: Family Medicine

## 2018-01-30 VITALS — BP 120/74 | HR 76 | Temp 98.1°F | Ht 69.0 in | Wt 174.6 lb

## 2018-01-30 DIAGNOSIS — Z23 Encounter for immunization: Secondary | ICD-10-CM | POA: Diagnosis not present

## 2018-01-30 DIAGNOSIS — Z0000 Encounter for general adult medical examination without abnormal findings: Secondary | ICD-10-CM

## 2018-01-30 DIAGNOSIS — R400 Somnolence: Secondary | ICD-10-CM

## 2018-01-30 DIAGNOSIS — G4730 Sleep apnea, unspecified: Secondary | ICD-10-CM

## 2018-01-30 NOTE — Assessment & Plan Note (Signed)
-  Well adult Orders Placed This Encounter  Procedures  . Flu Vaccine QUAD 36+ mos IM  . Tdap vaccine greater than or equal to 25yo IM  . Ambulatory referral to Sleep Studies    Referral Priority:   Routine    Referral Type:   Consultation    Referral Reason:   Specialty Services Required    Number of Visits Requested:   1  Immunizations: Flu and Tdap Screenings: Up to date Anticipatory guidance/Risk factor reduction:  Per AVS

## 2018-01-30 NOTE — Progress Notes (Signed)
Tracey Gibson - 27 y.o. female MRN [id number]  Date of birth: 11-Dec-1990  Subjective Chief Complaint  Patient presents with  . Annual Exam    pt sts she is feeling good. last pap 04/2017 w/ [facility]    HPI Tracey Gibson is a 27 y.o. female with history of ADHD, chronic PTSD and OCD here today for annual exam.  She reports she is doing well with current medications and is followed by psychiatry for management of medications.  She reports that she feels like she sleeps well however does not feel well rested throughout the day.  She states that her boyfriend tells her that she snores loudly and stops breathing/appears to be choking while sleeping.  She is concerned about OSA.  Her mother has history of OSA as well.  She is a non-smoker and uses EtOH occasionally.   Review of Systems  Constitutional: Positive for malaise/fatigue. Negative for chills, fever and weight loss.  HENT: Negative for congestion, ear pain and sore throat.   Eyes: Negative for blurred vision, double vision and pain.  Respiratory: Negative for cough and shortness of breath.   Cardiovascular: Negative for chest pain and palpitations.  Gastrointestinal: Negative for abdominal pain, blood in stool, constipation, heartburn and nausea.  Genitourinary: Negative for dysuria and urgency.  Musculoskeletal: Negative for joint pain and myalgias.  Neurological: Negative for dizziness and headaches.  Endo/Heme/Allergies: Does not bruise/bleed easily.  Psychiatric/Behavioral: Negative for depression. The patient is not nervous/anxious.     No Known Allergies  Past Medical History:  Diagnosis Date  . ADHD   . Anxiety   . Depression     Past Surgical History:  Procedure Laterality Date  . WISDOM TOOTH EXTRACTION      Social History   Socioeconomic History  . Marital status: Single    Spouse name: Not on file  . Number of children: Not on file  . Years of education: Not on file  . Highest education level: Not  on file  Occupational History  . Not on file  Social Needs  . Financial resource strain: Not on file  . Food insecurity:    Worry: Not on file    Inability: Not on file  . Transportation needs:    Medical: Not on file    Non-medical: Not on file  Tobacco Use  . Smoking status: Never Smoker  . Smokeless tobacco: Never Used  Substance and Sexual Activity  . Alcohol use: Yes  . Drug use: Not on file  . Sexual activity: Not on file  Lifestyle  . Physical activity:    Days per week: Not on file    Minutes per session: Not on file  . Stress: Not on file  Relationships  . Social connections:    Talks on phone: Not on file    Gets together: Not on file    Attends religious service: Not on file    Active member of club or organization: Not on file    Attends meetings of clubs or organizations: Not on file    Relationship status: Not on file  Other Topics Concern  . Not on file  Social History Narrative  . Not on file    Family History  Problem Relation Age of Onset  . Alcoholism Maternal Grandmother   . Suicidality Maternal Grandfather   . ADD / ADHD Maternal Aunt   . Bipolar disorder Maternal Aunt   . Alcohol abuse Maternal Aunt  Health Maintenance  Topic Date Due  . HIV Screening  08/04/2007  . PAP SMEAR  04/04/2020  . TETANUS/TDAP  01/31/2028  . INFLUENZA VACCINE  Completed    ----------------------------------------------------------------------------------------------------------------------------------------------------------------------------------------------------------------- Physical Exam BP 120/74 (BP Location: Right Arm, Patient Position: Sitting, Cuff Size: Normal)   Pulse 76   Temp 98.1 F (36.7 C) (Oral)   Ht 5\' 9"  (1.753 m)   Wt 174 lb 9.6 oz (79.2 kg)   LMP 12/31/2017   SpO2 98%   BMI 25.78 kg/m   Physical Exam  Constitutional: She is oriented to person, place, and time. She appears well-nourished. No distress.  HENT:  Head:  Normocephalic and atraumatic.  Nose: Nose normal.  Mouth/Throat: Oropharynx is clear and moist.  Eyes: Conjunctivae are normal. No scleral icterus.  Neck: Normal range of motion. Neck supple. No thyromegaly present.  Cardiovascular: Normal rate, regular rhythm and normal heart sounds.  Pulmonary/Chest: Effort normal and breath sounds normal.  Abdominal: Soft. Bowel sounds are normal. She exhibits no distension. There is no tenderness. There is no guarding.  Musculoskeletal: Normal range of motion. She exhibits no edema.  Lymphadenopathy:    She has no cervical adenopathy.  Neurological: She is alert and oriented to person, place, and time. No cranial nerve deficit. Coordination normal.  Skin: Skin is warm and dry. No rash noted.  Psychiatric: She has a normal mood and affect. Her behavior is normal.    ------------------------------------------------------------------------------------------------------------------------------------------------------------------------------------------------------------------- Assessment and Plan  Well adult exam -Well adult Orders Placed This Encounter  Procedures  . Flu Vaccine QUAD 36+ mos IM  . Tdap vaccine greater than or equal to 7yo IM  . Ambulatory referral to Sleep Studies    Referral Priority:   Routine    Referral Type:   Consultation    Referral Reason:   Specialty Services Required    Number of Visits Requested:   1  Immunizations: Flu and Tdap Screenings: Up to date Anticipatory guidance/Risk factor reduction:  Per AVS

## 2018-01-30 NOTE — Patient Instructions (Addendum)
It was very nice to meet you today! I have placed an order for a sleep study.  Please follow up annually or as needed.   Adult Wellness Guidelines   Adult Health - for Ages 74 and Over Preventive care is very important for adults. By making some good basic health choices, women and men can boost their own health and well-being. Some of these positive choices include:   Eat a healthy diet  Get regular exercise  Don't use tobacco products  Limit alcohol use  Strive for a healthy weight  Adult Recommendations Screenings Physical Exam Every year, or as directed by your doctor. Body Mass Index (BMI) Every year. Blood Pressure (BP) At least every two years. Colon Cancer Screening Beginning at age 80 - colonoscopy every 10 years, or flexible sigmoidoscopy every five years or fecal blood test annually. Diabetes Screening Those with high blood pressure or high cholesterol should be screened. Others, especially those who are overweight or have a close family history of diabetes, should consider being screened every three years. Vision Screening Every year.  Immunizations Tetanus, Diphtheria, Pertussis (Td/ Tdap) Get Tdap vaccine once, then a Td booster every 10 years. Influenza (Flu) Every year. Herpes Zoster (Shingles) One dose given at age 72 and over. Varicella (Chicken Pox) Two doses if no evidence of immunity. Pneumococcal (Pneumonia) One or two doses for adults age 9 and older, or one or two doses depending on indication. Measles, Mumps, Rubella (MMR) One or two doses for adults ages 21-55 if no evidence of immunity. Human Papillomavirus (HPV) Three doses for women ages 19-26 if not already given. Three doses for men ages 19-21 if not already given.* Hepatitis A Two or three doses for adults age 33 and over.** Hepatitis B Three doses for ages 16 and over.** * Recommendations may vary. Discuss the start and frequency of screenings with your doctor, especially if you are at  increased risk. ** For select populations. Discuss with your doctor if this vaccine is right for you.  Women's Health Women have their own unique health care needs. To stay well, they should make regular screenings a priority. Women should discuss the recommendations listed on the chart with their doctors. Women's Recommendations Mammogram Every year for women beginning at age 44.* Cholesterol Starting age and frequency of screenings are based on your individual risk factors. Talk with your doctor about what is best for you. Pap Test Women ages 21-65: Pap test every three years. Another option for ages 62-65: Pap test and HPV test every five years. Women who have had a hysterectomy or are over age 30 may not need a Pap test.* Osteoporosis Screening Beginning at age 57, or at age 88 if risk factors are present.* Aspirin Use At ages 86-79, talk with your doctor about the benefits and risks of aspirin use. Pelvic Exam Every year for ages 78 and over. Folic Acid Women planning/capable of pregnancy should take a daily supplement containing .4-.8 mg of folic acid for prevention of neural tube defects.  * Recommendations may vary. Discuss screening options with your doctor, especially if you are at increased risk. Sources: [place] Department of Health and Financial controller and the Centers for Disease Control and Prevention, U.S. Preventive Services Task Force   Men's Health Recommendations Men are encouraged to get care as needed and make smart choices. That includes following a healthy lifestyle and getting recommended preventive care services.  Recommended preventative care services are as follows:  Cholesterol Ages 55-35  should be tested if at high risk. Men age 12 and over should be tested. Prostate Cancer Screening Ages 76 and over, discuss the benefits and risks of screening with your doctor.* Abdominal Aortic Aneurysm Once between ages 26 and 50 if you have ever  smoked.

## 2018-03-13 ENCOUNTER — Encounter: Payer: Self-pay | Admitting: Psychiatry

## 2018-03-13 ENCOUNTER — Ambulatory Visit (INDEPENDENT_AMBULATORY_CARE_PROVIDER_SITE_OTHER): Payer: PRIVATE HEALTH INSURANCE | Admitting: Psychiatry

## 2018-03-13 VITALS — BP 114/72 | HR 72 | Ht 69.0 in | Wt 178.0 lb

## 2018-03-13 DIAGNOSIS — F341 Dysthymic disorder: Secondary | ICD-10-CM

## 2018-03-13 DIAGNOSIS — F4312 Post-traumatic stress disorder, chronic: Secondary | ICD-10-CM | POA: Diagnosis not present

## 2018-03-13 DIAGNOSIS — F428 Other obsessive-compulsive disorder: Secondary | ICD-10-CM | POA: Diagnosis not present

## 2018-03-13 DIAGNOSIS — F900 Attention-deficit hyperactivity disorder, predominantly inattentive type: Secondary | ICD-10-CM

## 2018-03-13 MED ORDER — AMPHETAMINE-DEXTROAMPHETAMINE 20 MG PO TABS: 20.0000 mg | ORAL_TABLET | Freq: Every day | ORAL | 0 refills | Status: DC

## 2018-03-13 MED ORDER — TRAZODONE HCL 100 MG PO TABS: 100.0000 mg | ORAL_TABLET | Freq: Every day | ORAL | 1 refills | Status: DC

## 2018-03-13 MED ORDER — FLUVOXAMINE MALEATE 100 MG PO TABS: 100.0000 mg | ORAL_TABLET | Freq: Every day | ORAL | 1 refills | Status: DC
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
## 2018-04-10 ENCOUNTER — Ambulatory Visit: Payer: PRIVATE HEALTH INSURANCE | Admitting: Neurology

## 2018-04-10 ENCOUNTER — Encounter: Payer: Self-pay | Admitting: Neurology

## 2018-04-10 VITALS — BP 113/68 | HR 64 | Ht 67.0 in | Wt 179.0 lb

## 2018-04-10 DIAGNOSIS — E663 Overweight: Secondary | ICD-10-CM | POA: Diagnosis not present

## 2018-04-10 DIAGNOSIS — R0683 Snoring: Secondary | ICD-10-CM | POA: Diagnosis not present

## 2018-04-10 DIAGNOSIS — G478 Other sleep disorders: Secondary | ICD-10-CM | POA: Diagnosis not present

## 2018-04-10 DIAGNOSIS — Z820 Family history of epilepsy and other diseases of the nervous system: Secondary | ICD-10-CM

## 2018-04-10 NOTE — Patient Instructions (Signed)

## 2018-04-10 NOTE — Progress Notes (Signed)
Subjective:    Patient ID: Tracey Gibson is a 28 y.o. female.  HPI     Huston FoleySaima Antonietta Lansdowne, MD, PhD [facility] [address], [address] [address] [place], [place] [address]  Dear Dr. Ashley RoyaltyMatthews,  I saw your patient, Tracey Gibson, upon your kind request in the sleep clinic today for initial consultation of her sleep disorder, in particular, concern for underlying obstructive sleep apnea. The patient is unaccompanied today. As you know, Ms. Tracey Gibson is a 28 year old female with an underlying medical history of PTSD, OCD, ADHD (followed by psychiatry), and overweight state, who reports snoring and a family history of OSA. I reviewed your office note from 01/30/2018.  She has gained weight in the past few months, in the realm of 20 pounds. She has discussed it with her psychiatrist and there is no obvious cause for this. Her snoring can be loud, keeps her boyfriend up at times. She lives with her boyfriend. She works for [employer] She feels tired, but does not doze of inadvertently, but can nap, especially, on a weekend. 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 She does not wake up rested. She denies AM HAs and night to night nocturia, no RLS Sx. She has one dog, one cat, not on the bed at night. She has a TV in the BR, typically not on at night. Bedtime is around 10 and rise time typically 7:15 AM, but sometimes does not get out of bed until 7:45 AM. She has black out curtains and sleeps with a sound machine on.  She smokes about once or 2 times a month, hookah, no cigarettes, alcohol about 3 times a week and caffeine daily, typically one serving. Her Epworth sleepiness score is 3 out of 24 today, fatigue score is 46 out of 63. Her mother has sleep apnea, and uses a CPAP.   Her Past Medical History Is Significant For: Past Medical History:  Diagnosis Date  . ADHD   . Anxiety   . Depression     Her Past Surgical History  Is Significant For: Past Surgical History:  Procedure Laterality Date  . WISDOM TOOTH EXTRACTION      Her Family History Is Significant For: Family History  Problem Relation Age of Onset  . Alcoholism Maternal Grandmother   . Suicidality Maternal Grandfather   . ADD / ADHD Maternal Aunt   . Bipolar disorder Maternal Aunt   . Alcohol abuse Maternal Aunt     Her Social History Is Significant For: Social History   Socioeconomic History  . Marital status: Single    Spouse name: Not on file  . Number of children: Not on file  . Years of education: Not on file  . Highest education level: Not on file  Occupational History  . Not on file  Social Needs  . Financial resource strain: Not on file  . Food insecurity:    Worry: Not on file    Inability: Not on file  . Transportation needs:    Medical: Not on file    Non-medical: Not on file  Tobacco Use  . Smoking status: Never Smoker  . Smokeless tobacco: Never Used  Substance and Sexual Activity  . Alcohol use: Yes  . Drug use: Not on file  . Sexual activity: Not on file  Lifestyle  . Physical activity:    Days per week: Not on file    Minutes per  session: Not on file  . Stress: Not on file  Relationships  . Social connections:    Talks on phone: Not on file    Gets together: Not on file    Attends religious service: Not on file    Active member of club or organization: Not on file    Attends meetings of clubs or organizations: Not on file    Relationship status: Not on file  Other Topics Concern  . Not on file  Social History Narrative  . Not on file    Her Allergies Are:  No Known Allergies:   Her Current Medications Are:  Outpatient Encounter Medications as of 04/10/2018  Medication Sig  . amphetamine-dextroamphetamine (ADDERALL) 20 MG tablet Take 1 tablet (20 mg total) by mouth daily for 30 days.  Melene Muller ON 04/12/2018] amphetamine-dextroamphetamine (ADDERALL) 20 MG tablet Take 1 tablet (20 mg total) by  mouth daily for 30 days.  Melene Muller ON 05/12/2018] amphetamine-dextroamphetamine (ADDERALL) 20 MG tablet Take 1 tablet (20 mg total) by mouth daily for 30 days.  . cyclobenzaprine (FLEXERIL) 10 MG tablet Take 10 mg by mouth 3 (three) times daily.  . fluvoxaMINE (LUVOX) 100 MG tablet Take 1 tablet (100 mg total) by mouth at bedtime.  . meloxicam (MOBIC) 15 MG tablet TAKE 1 TABLET EVERY DAY WITH FOOD  . PARAGARD INTRAUTERINE COPPER IU by Intrauterine route.  . traZODone (DESYREL) 100 MG tablet Take 1 tablet (100 mg total) by mouth at bedtime.   No facility-administered encounter medications on file as of 04/10/2018.   :  Review of Systems:  Out of a complete 14 point review of systems, all are reviewed and negative with the exception of these symptoms as listed below: Review of Systems  Neurological:       Pt presents today to discuss her sleep. Pt has never had a sleep study but does endorse snoring.  Epworth Sleepiness Scale 0= would never doze 1= slight chance of dozing 2= moderate chance of dozing 3= high chance of dozing  Sitting and reading: 0 Watching TV: 1 Sitting inactive in a public place (ex. Theater or meeting): 0 As a passenger in a car for an hour without a break: 0 Lying down to rest in the afternoon: 2 Sitting and talking to someone: 0 Sitting quietly after lunch (no alcohol): 0 In a car, while stopped in traffic: 0 Total: 3     Objective:  Neurological Exam  Physical Exam Physical Examination:   Vitals:   04/10/18 1039  BP: 113/68  Pulse: 64    General Examination: The patient is a very pleasant 28 y.o. female in no acute distress. She appears well-developed and well-nourished and well groomed.   HEENT: Normocephalic, atraumatic, pupils are equal, round and reactive to light and accommodation. Extraocular tracking is good without limitation to gaze excursion or nystagmus noted. Normal smooth pursuit is noted. Hearing is grossly intact. Face is symmetric  with normal facial animation and normal facial sensation. Speech is clear with no dysarthria noted. There is no hypophonia. There is no lip, neck/head, jaw or voice tremor. Neck is supple with full range of passive and active motion. There are no carotid bruits on auscultation. Oropharynx exam reveals: mild mouth dryness, good dental hygiene and moderate airway crowding, due to smaller airway entry, longer and slightly wider appearing uvula, tonsils in place. Mallampati is class II. Tongue protrudes centrally and palate elevates symmetrically. Tonsils are 2+ in size. Neck size is 13.75 inches. She has  a mild overbite.  Chest: Clear to auscultation without wheezing, rhonchi or crackles noted.  Heart: S1+S2+0, regular and normal without murmurs, rubs or gallops noted.   Abdomen: Soft, non-tender and non-distended with normal bowel sounds appreciated on auscultation.  Extremities: There is no pitting edema in the distal lower extremities bilaterally.   Skin: Warm and dry without trophic changes noted.  Musculoskeletal: exam reveals no obvious joint deformities, tenderness or joint swelling or erythema.   Neurologically:  Mental status: The patient is awake, alert and oriented in all 4 spheres. Her immediate and remote memory, attention, language skills and fund of knowledge are appropriate. There is no evidence of aphasia, agnosia, apraxia or anomia. Speech is clear with normal prosody and enunciation. Thought process is linear. Mood is normal and affect is normal.  Cranial nerves II - XII are as described above under HEENT exam. In addition: shoulder shrug is normal with equal shoulder height noted. Motor exam: Normal bulk, strength and tone is noted. There is no tremor. Romberg is negative. Fine motor skills and coordination: intact with normal finger taps, normal hand movements, normal rapid alternating patting, normal foot taps and normal foot agility.  Cerebellar testing: No dysmetria or  intention tremor. There is no truncal or gait ataxia.  Sensory exam: intact to light touch in the upper and lower extremities.  Gait, station and balance: She stands easily. No veering to one side is noted. No leaning to one side is noted. Posture is age-appropriate and stance is narrow based. Gait shows normal stride length and normal pace. No problems turning are noted. Tandem walk is unremarkable.  Assessment and Plan:  In summary, Kyllie S Tracey Gibson is a very pleasant 28 y.o.-year old female with an underlying medical history of PTSD, OCD, ADHD (by chart review, followed by psychiatry), and overweight state, whose history and physical exam are probableconcerning for obstructive sleep apnea (OSA). I had a long chat with the patient about my findings and the diagnosis of OSA, its prognosis and treatment options. We talked about medical treatments, surgical interventions and non-pharmacological approaches. I explained in particular the risks and ramifications of untreated moderate to severe OSA, especially with respect to developing cardiovascular disease down the Road, including congestive heart failure, difficult to treat hypertension, cardiac arrhythmias, or stroke. Even type 2 diabetes has, in part, been linked to untreated OSA. Symptoms of untreated OSA include daytime sleepiness, memory problems, mood irritability and mood disorder such as depression and anxiety, lack of energy, as well as recurrent headaches, especially morning headaches. We talked about smoking cessation and trying to maintain a healthy lifestyle in general, as well as the importance of weight control. I encouraged the patient to eat healthy, exercise daily and keep well hydrated, to keep a scheduled bedtime and wake time routine, to not skip any meals and eat healthy snacks in between meals. I advised the patient not to drive when feeling sleepy. I recommended the following at this time: sleep study with potential positive airway  pressure titration. (We will score hypopneas at 3%).   I explained the sleep test procedure to the patient and also outlined possible surgical and non-surgical treatment options of OSA, including the use of a custom-made dental device (which would require a referral to a specialist dentist or oral surgeon), upper airway surgical options, such as pillar implants, radiofrequency surgery, tongue base surgery, and UPPP (which would involve a referral to an ENT surgeon). Rarely, jaw surgery such as mandibular advancement may be considered.  I  also explained the CPAP treatment option to the patient, who indicated that she would be willing to try CPAP if the need arises. I explained the importance of being compliant with PAP treatment, not only for insurance purposes but primarily to improve Her symptoms, and for the patient's long term health benefit, including to reduce Her cardiovascular risks. I answered all her questions today and the patient was in agreement. I plan to see her back after the sleep study is completed and encouraged her to call with any interim questions, concerns, problems or updates.   Thank you very much for allowing me to participate in the care of this nice patient. If I can be of any further assistance to you please do not hesitate to call me at [phone].  Sincerely,   Star Age, MD, PhD

## 2018-04-13 ENCOUNTER — Encounter: Payer: Self-pay | Admitting: Registered Nurse

## 2018-04-13 ENCOUNTER — Ambulatory Visit: Payer: Self-pay | Admitting: Registered Nurse

## 2018-04-13 VITALS — BP 105/72 | HR 80 | Temp 97.3°F

## 2018-04-13 DIAGNOSIS — J301 Allergic rhinitis due to pollen: Secondary | ICD-10-CM

## 2018-04-13 DIAGNOSIS — H6593 Unspecified nonsuppurative otitis media, bilateral: Secondary | ICD-10-CM

## 2018-04-13 MED ORDER — SALINE SPRAY 0.65 % NA SOLN: 2.0000 | NASAL | 0 refills | Status: DC

## 2018-04-13 MED ORDER — CETIRIZINE HCL 10 MG PO TABS: 10.0000 mg | ORAL_TABLET | Freq: Every day | ORAL | 11 refills | Status: DC

## 2018-04-13 MED ORDER — FLUTICASONE PROPIONATE 50 MCG/ACT NA SUSP: 1.0000 | Freq: Two times a day (BID) | NASAL | 6 refills | Status: DC

## 2018-04-13 NOTE — Progress Notes (Signed)
Subjective:    Patient ID: Tracey Gibson, female    DOB: 06/30/1990, 28 y.o.   MRN: [id number]  27y/o Caucasian established female pt c/o L otalgia x2 weeks progressively worsening. Now with intermittent aching, "wet feeling", occasionally muffled sounds/hearing. Pain over L eustachian tube. Advil at home for pain.  Hasn't started spring allergy routine yet typically seasonal allergies flare in spring.     Review of Systems  Constitutional: Negative for activity change, appetite change, chills, diaphoresis, fatigue, fever and unexpected weight change.  HENT: Positive for congestion and ear pain. Negative for dental problem, drooling, ear discharge, facial swelling, hearing loss, mouth sores, nosebleeds, postnasal drip, rhinorrhea, sinus pressure, sinus pain, sneezing, sore throat, tinnitus, trouble swallowing and voice change.   Eyes: Negative for photophobia, pain, discharge, redness, itching and visual disturbance.  Respiratory: Negative for cough, choking, shortness of breath, wheezing and stridor.   Cardiovascular: Negative for chest pain and palpitations.  Gastrointestinal: Negative for abdominal pain, diarrhea, nausea and vomiting.  Endocrine: Negative for cold intolerance and heat intolerance.  Genitourinary: Negative for difficulty urinating, dysuria and hematuria.  Musculoskeletal: Negative for neck pain and neck stiffness.  Skin: Negative for color change, pallor, rash and wound.  Allergic/Immunologic: Positive for environmental allergies. Negative for food allergies.  Neurological: Negative for dizziness, tremors, seizures, syncope, facial asymmetry, speech difficulty, weakness, light-headedness, numbness and headaches.  Hematological: Negative for adenopathy. Does not bruise/bleed easily.  Psychiatric/Behavioral: Negative for agitation, behavioral problems, confusion and sleep disturbance.       Objective:   Physical Exam Vitals signs and nursing note reviewed.   Constitutional:      General: She is awake. She is not in acute distress.    Appearance: Normal appearance. She is well-developed and well-groomed. She is not ill-appearing, toxic-appearing or diaphoretic.  HENT:     Head: Normocephalic and atraumatic.     Jaw: There is normal jaw occlusion. No trismus.     Salivary Glands: Right salivary gland is not diffusely enlarged or tender. Left salivary gland is not diffusely enlarged or tender.     Right Ear: Hearing, ear canal and external ear normal. A middle ear effusion is present. There is no impacted cerumen.     Left Ear: Hearing, ear canal and external ear normal. A middle ear effusion is present. There is no impacted cerumen.     Nose: Mucosal edema, congestion and rhinorrhea present. No nasal deformity, septal deviation or laceration.     Right Turbinates: Enlarged and swollen. Not pale.     Left Turbinates: Enlarged and swollen. Not pale.     Right Sinus: No maxillary sinus tenderness or frontal sinus tenderness.     Left Sinus: No maxillary sinus tenderness or frontal sinus tenderness.     Mouth/Throat:     Lips: Pink. No lesions.     Mouth: Mucous membranes are moist. Mucous membranes are not pale, not dry and not cyanotic. No injury, lacerations, oral lesions or angioedema.     Dentition: Normal dentition. Does not have dentures. No dental caries or dental abscesses.     Tongue: No lesions.     Pharynx: Uvula midline. Pharyngeal swelling and posterior oropharyngeal erythema present. No oropharyngeal exudate or uvula swelling.     Tonsils: No tonsillar exudate or tonsillar abscesses. Swelling: 0 on the right. 0 on the left.     Comments: Bilateral TMs air fluid level clear; cobblestoning posterior pharynx; bilateral nasal turbinates edema/erythema clear discharge; bilateral allergic shiners Eyes:  General: Lids are normal. Allergic shiner present. No visual field deficit or scleral icterus.       Right eye: No foreign body,  discharge or hordeolum.        Left eye: No foreign body, discharge or hordeolum.     Extraocular Movements: Extraocular movements intact.     Right eye: Normal extraocular motion and no nystagmus.     Left eye: Normal extraocular motion and no nystagmus.     Conjunctiva/sclera: Conjunctivae normal.     Right eye: Right conjunctiva is not injected. No chemosis, exudate or hemorrhage.    Left eye: Left conjunctiva is not injected. No chemosis, exudate or hemorrhage.    Pupils: Pupils are equal, round, and reactive to light. Pupils are equal.     Right eye: Pupil is round and reactive.     Left eye: Pupil is round and reactive.  Neck:     Musculoskeletal: Normal range of motion and neck supple. Normal range of motion. No edema, erythema, neck rigidity, crepitus, injury, pain with movement, torticollis, spinous process tenderness or muscular tenderness.     Thyroid: No thyroid mass or thyromegaly.     Trachea: Trachea and phonation normal. No tracheal tenderness or tracheal deviation.  Cardiovascular:     Rate and Rhythm: Normal rate and regular rhythm.     Chest Wall: PMI is not displaced.     Heart sounds: Normal heart sounds, S1 normal and S2 normal. No murmur. No friction rub. No gallop.   Pulmonary:     Effort: Pulmonary effort is normal. No accessory muscle usage or respiratory distress.     Breath sounds: Normal breath sounds and air entry. No stridor, decreased air movement or transmitted upper airway sounds. No decreased breath sounds, wheezing, rhonchi or rales.     Comments: No cough observed in exam room; spoke full sentneces without difficulty Chest:     Chest wall: No tenderness.  Abdominal:     General: There is no distension.     Palpations: Abdomen is soft.  Musculoskeletal: Normal range of motion.        General: No swelling, tenderness or signs of injury.     Right shoulder: Normal.     Left shoulder: Normal.     Right elbow: Normal.    Left elbow: Normal.     Right  hip: Normal.     Left hip: Normal.     Right knee: Normal.     Left knee: Normal.     Cervical back: Normal.     Thoracic back: Normal.     Lumbar back: Normal.     Right hand: Normal.     Left hand: Normal.     Right lower leg: No edema.     Left lower leg: No edema.  Lymphadenopathy:     Head:     Right side of head: No submental, submandibular, tonsillar, preauricular, posterior auricular or occipital adenopathy.     Left side of head: No submental, submandibular, tonsillar, preauricular, posterior auricular or occipital adenopathy.     Cervical: No cervical adenopathy.     Right cervical: No superficial, deep or posterior cervical adenopathy.    Left cervical: No superficial, deep or posterior cervical adenopathy.  Skin:    General: Skin is warm and dry.     Capillary Refill: Capillary refill takes less than 2 seconds.     Coloration: Skin is not ashen, cyanotic, jaundiced, mottled, pale or sallow.     Findings:  No abrasion, abscess, bruising, burn, ecchymosis, erythema, signs of injury, laceration, lesion, petechiae, rash or wound.     Nails: There is no clubbing.   Neurological:     General: No focal deficit present.     Mental Status: She is alert and oriented to person, place, and time. Mental status is at baseline. She is not disoriented.     GCS: GCS eye subscore is 4. GCS verbal subscore is 5. GCS motor subscore is 6.     Cranial Nerves: Cranial nerves are intact. No cranial nerve deficit, dysarthria or facial asymmetry.     Sensory: Sensation is intact. No sensory deficit.     Motor: Motor function is intact. No weakness, tremor, atrophy, abnormal muscle tone or seizure activity.     Coordination: Coordination is intact. Coordination normal.     Gait: Gait is intact. Gait normal.     Comments: On/off exam table; in/out of chair without difficulty; gait sure and steady in hallway  Psychiatric:        Attention and Perception: Attention and perception normal.         Mood and Affect: Mood and affect normal.        Speech: Speech normal.        Behavior: Behavior normal. Behavior is cooperative.        Thought Content: Thought content normal.        Cognition and Memory: Cognition and memory normal.        Judgment: Judgment normal.           Assessment & Plan:  A-bilateral otitis media effusion; seasonal allergic rhinitis pollen  P-restart flonase, zyrtec, saline for the next 60 days to get through spring bloom.  Has used zyrtec D in the past with good relief discussed with her she may use sudafed if still having breakthrough rhinitis/post nasal drip.  Patient may use normal saline nasal spray 2 sprays each nostril q2h wa as needed. flonase 50mcg 1 spray each nostril BID #1 RF6.  Patient denied personal or family history of ENT cancer.  OTC antihistamine of choice zyrtec 10mg  po daily #30 RF11.  Avoid triggers if possible.  Shower prior to bedtime if exposed to triggers.  If allergic dust/dust mites recommend mattress/pillow covers/encasements; washing linens, vacuuming, sweeping, dusting weekly.  Call or return to clinic as needed if these symptoms worsen or fail to improve as anticipated.   Exitcare handout on allergic rhinitis and sinus rinse.  Patient verbalized understanding of instructions, agreed with plan of care and had no further questions at this time.  P2:  Avoidance and hand washing.  Discussed need to get postnasal drip under control.  Then throat irritation will resolve so eustachian tubes can drain again up to 30 days for middle ear fluid to resolve may notice popping as fluid drains.  Supportive treatment.   No evidence of invasive bacterial infection, non toxic and well hydrated.  This is most likely self limiting viral infection.  I do not see where any further testing or imaging is necessary at this time.   I will suggest supportive care, rest, good hygiene and encourage the patient to take adequate fluids.  The patient is to return to  clinic or EMERGENCY ROOM if symptoms worsen or change significantly e.g. ear pain, fever, purulent discharge from ears or bleeding.  Exitcare handout on otitis media with effusion.  Patient verbalized agreement and understanding of treatment plan.

## 2018-05-04 ENCOUNTER — Ambulatory Visit (INDEPENDENT_AMBULATORY_CARE_PROVIDER_SITE_OTHER): Payer: PRIVATE HEALTH INSURANCE | Admitting: Neurology

## 2018-05-04 DIAGNOSIS — E663 Overweight: Secondary | ICD-10-CM

## 2018-05-04 DIAGNOSIS — G478 Other sleep disorders: Secondary | ICD-10-CM

## 2018-05-04 DIAGNOSIS — R0683 Snoring: Secondary | ICD-10-CM

## 2018-05-04 DIAGNOSIS — Z820 Family history of epilepsy and other diseases of the nervous system: Secondary | ICD-10-CM

## 2018-05-04 DIAGNOSIS — G4710 Hypersomnia, unspecified: Secondary | ICD-10-CM | POA: Diagnosis not present

## 2018-05-08 ENCOUNTER — Telehealth: Payer: Self-pay

## 2018-05-08 NOTE — Telephone Encounter (Signed)
I called pt to discuss her sleep study results. No answer, left a message asking her to call me back. 

## 2018-05-08 NOTE — Procedures (Signed)
PATIENT'S NAME:  Tracey Gibson, Tracey Gibson DOB:      [birth year]      MR#:    [id number]     DATE OF RECORDING: 05/04/2018 REFERRING M.D.:  Everrett Coombe, DO Study Performed:   Baseline Polysomnogram HISTORY: 28 year old female with a history of mood disorder, ADHD and overweight state, who reports snoring and a family history of OSA. I reviewed your office note from 01/30/2018. The patient endorsed the Epworth Sleepiness Scale at 3 points. The patient's weight 179 pounds with a height of 67 (inches), resulting in a BMI of 28. kg/m2. The patient's neck circumference measured 13.8 inches.  CURRENT MEDICATIONS: Adderall, Flexeril, Lovox, Mobic, Desyrell   PROCEDURE:  This is a multichannel digital polysomnogram utilizing the Somnostar 11.2 system.  Electrodes and sensors were applied and monitored per AASM Specifications.   EEG, EOG, Chin and Limb EMG, were sampled at 200 Hz.  ECG, Snore and Nasal Pressure, Thermal Airflow, Respiratory Effort, CPAP Flow and Pressure, Oximetry was sampled at 50 Hz. Digital video and audio were recorded.      BASELINE STUDY  Lights Out was at 20:59 and Lights On at 05:04.  Total recording time (TRT) was 486 minutes, with a total sleep time (TST) of 435.5 minutes.   The patient's sleep latency was 29 minutes.  REM latency was 215.5 minutes, which is delayed. The sleep efficiency was 89.6 %.     SLEEP ARCHITECTURE: WASO (Wake after sleep onset) was 23 minutes with minimal to mild sleep fragmentation noted. There were 6.5 minutes in Stage N1, 284.5 minutes Stage N2, 63 minutes Stage N3 and 81.5 minutes in Stage REM.  The percentage of Stage N1 was 1.5%, Stage N2 was 65.3%, which is mildly increased, Stage N3 was 14.5%, which is near normal, and Stage R (REM sleep) was 18.7%, which is near normal. The arousals were noted as: 52 were spontaneous, 0 were associated with PLMs, 0 were associated with respiratory events.  RESPIRATORY ANALYSIS:  There were a total of 0 respiratory events:  0  obstructive apneas, 0 central apneas and 0 mixed apneas with a total of 0 apneas and an apnea index (AI) of 0 /hour. There were 0 hypopneas with a hypopnea index of 0 /hour. The patient also had 0 respiratory event related arousals (RERAs).      The total APNEA/HYPOPNEA INDEX (AHI) was 0/hour and the total RESPIRATORY DISTURBANCE INDEX was 0 /hour.  0 events occurred in REM sleep and 0 events in NREM. The REM AHI was 0 /hour, versus a non-REM AHI of 0. The patient spent 174 minutes of total sleep time in the supine position and 262 minutes in non-supine.. The supine AHI was 0.0 versus a non-supine AHI of 0.0.  OXYGEN SATURATION & C02:  The Wake baseline 02 saturation was 99%, with the lowest being 92%. Time spent below 89% saturation equaled 0 minutes.  PERIODIC LIMB MOVEMENTS:   The patient had a total of 0 Periodic Limb Movements.  The Periodic Limb Movement (PLM) index was 0 and the PLM Arousal index was 0/hour.   Audio and video analysis did not show any abnormal or unusual movements, behaviors, phonations or vocalizations. The patient took no bathroom breaks. Mild intermittent snoring was noted. The EKG was in keeping with normal sinus rhythm (NSR).  Post-study, the patient indicated that sleep was worse than usual.   IMPRESSION:  1. Primary Snoring  RECOMMENDATIONS:  1. This study does not demonstrate any significant obstructive or central sleep disordered breathing, except  for mild, intermittent snoring. This study does not support an intrinsic sleep disorder as a cause of the patient's symptoms. Other causes, including circadian rhythm disturbances, an underlying mood disorder, medication effect and/or an underlying medical problem cannot be ruled out. 2. The patient should be cautioned not to drive, work at heights, or operate dangerous or heavy equipment when tired or sleepy. Review and reiteration of good sleep hygiene measures should be pursued with any patient. 3. The patient will  be advised to follow up with the referring provider, who will be notified of the test results.  I certify that I have reviewed the entire raw data recording prior to the issuance of this report in accordance with the Standards of Accreditation of the American Academy of Sleep Medicine (AASM)  Huston Foley, MD, PhD Diplomat, American Board of Neurology and Sleep Medicine (Neurology and Sleep Medicine)

## 2018-05-08 NOTE — Telephone Encounter (Signed)
Pt returned my call and I discussed her sleep study results with her. Pt will follow up with her PCP at this point. Pt verbalized understanding of results. Pt had no questions at this time but was encouraged to call back if questions arise.

## 2018-05-08 NOTE — Telephone Encounter (Signed)
-----   Message from Huston Foley, MD sent at 05/08/2018  8:09 AM EDT ----- Patient referred by Dr. Ashley Royalty, seen by me on 04/10/18, diagnostic PSG on 05/04/18.   Please call and notify the patient that the recent sleep study did not show any significant obstructive sleep apnea. Mild snoring was noted mostly. Achieved all stages of sleep, overall slept fairly well. I would recommend she FU with PCP at this point.   Thanks,  Huston Foley, MD, PhD [facility])

## 2018-05-08 NOTE — Progress Notes (Signed)
Patient referred by Dr. Ashley Royalty, seen by me on 04/10/18, diagnostic PSG on 05/04/18.   Please call and notify the patient that the recent sleep study did not show any significant obstructive sleep apnea. Mild snoring was noted mostly. Achieved all stages of sleep, overall slept fairly well. I would recommend she FU with PCP at this point.   Thanks,  Huston Foley, MD, PhD [facility])

## 2018-06-12 ENCOUNTER — Encounter: Payer: Self-pay | Admitting: Psychiatry

## 2018-06-12 ENCOUNTER — Ambulatory Visit (INDEPENDENT_AMBULATORY_CARE_PROVIDER_SITE_OTHER): Payer: PRIVATE HEALTH INSURANCE | Admitting: Psychiatry

## 2018-06-12 ENCOUNTER — Other Ambulatory Visit: Payer: Self-pay

## 2018-06-12 DIAGNOSIS — F900 Attention-deficit hyperactivity disorder, predominantly inattentive type: Secondary | ICD-10-CM

## 2018-06-12 DIAGNOSIS — F341 Dysthymic disorder: Secondary | ICD-10-CM

## 2018-06-12 DIAGNOSIS — F428 Other obsessive-compulsive disorder: Secondary | ICD-10-CM | POA: Diagnosis not present

## 2018-06-12 DIAGNOSIS — F4312 Post-traumatic stress disorder, chronic: Secondary | ICD-10-CM | POA: Diagnosis not present

## 2018-06-12 MED ORDER — AMPHETAMINE-DEXTROAMPHETAMINE 20 MG PO TABS: 20.0000 mg | ORAL_TABLET | Freq: Every day | ORAL | 0 refills | Status: DC

## 2018-06-12 NOTE — Progress Notes (Signed)
[facility] Med Check  Patient ID: Tracey Gibson,  MRN: [id number]  PCP: Everrett Coombe, DO  Date of Evaluation: 06/12/2018 Time spent:10 minutes from 1005 to 1015  I connected with patient by a video enabled telemedicine application or telephone, with their informed consent, and verified patient privacy and that I am speaking with the correct person using two identifiers.  I was located at [place] office and patient at home residence.  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  She is conflicted about her Adderall prescriptions with [place] registry documenting that she only filled the one prescription of 03/13/2017 obtained before that appointment which also sent one for February 13 and March 12 she has not filled.  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  She estimates a 5 pound weight gain as she has been at home without structure eating more.  We review previous vaping and cannabis with little now but addressing ways to stop, as she also has social alcohol at times.  She continues her IUD, and her back is better though she now estimates the discomfort is in need of a new mattress.  Mood is very good on the Luvox and trazodone also helping compulsions and posttraumatic anxiety.  Sleep study in the interim by neurology requested by Dr. Ashley Royalty documented mild snoring but no sleep  apnea.   Individual Medical History/ Review of Systems: Changes? :Yes Back is much better though having only modest pain now she attributes to her mattress.  Sleep study was negative with mild snoring but no apnea.  IUD is in place  Allergies: Patient has no known allergies.  Current Medications:  Current Outpatient Medications:  .  amphetamine-dextroamphetamine (ADDERALL) 20 MG tablet, Take 1 tablet (20 mg total) by mouth daily after breakfast for 30 days., Disp: 30 tablet, Rfl: 0 .  [START ON 07/12/2018] amphetamine-dextroamphetamine (ADDERALL) 20 MG tablet, Take 1 tablet (20 mg total) by mouth daily after breakfast for 30 days., Disp: 30 tablet, Rfl: 0 .  [START ON 08/11/2018] amphetamine-dextroamphetamine (ADDERALL) 20 MG tablet, Take 1 tablet (20 mg total) by mouth daily after breakfast for 30 days., Disp: 30 tablet, Rfl: 0 .  cetirizine (ZYRTEC) 10 MG tablet, Take 1 tablet (10 mg total) by mouth daily., Disp: 30 tablet, Rfl: 11 .  cyclobenzaprine (FLEXERIL) 10 MG tablet, Take 10 mg by mouth 3 (three) times daily., Disp: , Rfl: 0 .  fluticasone (FLONASE) 50 MCG/ACT nasal spray, Place 1 spray into both nostrils 2 (two) times daily., Disp: 16 g, Rfl: 6 .  fluvoxaMINE (LUVOX) 100 MG tablet, Take 1 tablet (100 mg total) by mouth at bedtime., Disp: 90 tablet, Rfl: 1 .  meloxicam (MOBIC) 15 MG tablet, TAKE 1 TABLET EVERY DAY WITH FOOD, Disp: , Rfl: 0 .  PARAGARD INTRAUTERINE COPPER IU, by Intrauterine route., Disp: , Rfl:  .  sodium chloride (OCEAN) 0.65 % SOLN nasal  spray, Place 2 sprays into both nostrils every 2 (two) hours while awake for 30 days., Disp: , Rfl: 0 .  traZODone (DESYREL) 100 MG tablet, Take 1 tablet (100 mg total) by mouth at bedtime., Disp: 90 tablet, Rfl: 1 Medication Side Effects: none  Family Medical/ Social History: Changes? No  MENTAL HEALTH EXAM:  There were no vitals taken for this visit.There is no height or weight on file to calculate BMI.  Estimating 5 pound gain  but not present to weigh  General Appearance: N/A  Eye Contact:  N/A  Speech:  Clear and Coherent, Normal Rate and Talkative  Volume:  Normal  Mood:  Anxious, Dysphoric and Euthymic  Affect:  Full Range  Thought Process:  Goal Directed and Irrelevant  Orientation:  Full (Time, Place, and Person)  Thought Content: Obsessions and Rumination   Suicidal Thoughts:  No  Homicidal Thoughts:  No  Memory:  Immediate;   Good Remote;   Good  Judgement:  Fair  Insight:  Fair  Psychomotor Activity:  Normal and Mannerisms  Concentration:  Concentration: Good and Attention Span: Fair  Recall:  Good  Fund of Knowledge: Fair  Language: Good  Assets:  Leisure Time Resilience Talents/Skills  ADL's:  Intact  Cognition: WNL  Prognosis:  Good    DIAGNOSES:    ICD-10-CM   1. 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 Chronic post-traumatic stress disorder F43.12   4. Other obsessive-compulsive disorders F42.8     Receiving Psychotherapy: Yes [facility]   RECOMMENDATIONS: Adderall 20 mg IR every morning is sent as #30 for April, May, and June for ADHD  and may also still have fills from February and March as she plans return in 6 months or sooner if needed.  She has just refilled her 25-month supply of Luvox 100 mg nightly and trazodone 100 mg nightly for OCD, PTSD and dysthymia addressing refill options depending on status as national emergency coronavirus pandemic continues.  He has therapy with [facility] and comprehensive general medical care.  Virtual Visit via Telephone Note  I connected with Tracey Gibson on 06/12/18 at 10:00 AM EDT by telephone and verified that I am speaking with the correct person using two identifiers.   I discussed the limitations, risks, security and privacy concerns of performing an evaluation and management service by telephone and the availability of in person appointments. I also discussed with the patient that there may be a patient responsible charge related to this service. The patient expressed understanding and agreed to proceed.   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    Observations/Objective: Mood:  Anxious, Dysphoric and Euthymic  Affect:  Full Range  Thought Process:  Goal Directed and Irrelevant    Assessment and Plan:  Adderall 20 mg IR every morning is sent as #30 for April, May, and June for ADHD  and may also still have fills from February and March.  She has just refilled her 77-month supply of Luvox 100 mg nightly and trazodone 100 mg nightly for OCD, PTSD and dysthymia  Follow Up Instructions: She plans return in 6 months or sooner if needed.   I discussed the assessment and treatment plan with the patient. The patient was provided an opportunity to ask questions and all were answered. The patient agreed with the plan and demonstrated an understanding of the instructions.   The patient was advised to call back or seek an in-person evaluation if the symptoms worsen or if the condition fails to improve as anticipated.  I provided 10 minutes of non-face-to-face time during this encounter.   Chauncey MannGlenn E Jennings, MD  Chauncey MannGlenn E Jennings, MD

## 2018-06-30 ENCOUNTER — Telehealth: Payer: Self-pay | Admitting: Family Medicine

## 2018-06-30 NOTE — Telephone Encounter (Signed)
I called and left message on patient voicemail to call office and schedule appointment for CPE.

## 2018-07-07 ENCOUNTER — Telehealth: Payer: Self-pay | Admitting: Psychiatry

## 2018-07-07 ENCOUNTER — Encounter: Payer: Self-pay | Admitting: Psychiatry

## 2018-07-07 NOTE — Telephone Encounter (Signed)
   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  No additional therapeutics have been found from neurology consultation and polysomnography arranged by primary care who continues to have assessment and treatment as well.  Current medications to facilitate optimal neuropsychiatric function integrate the current schedule of responsibilities and structured rest, including Adderall 20 mg IR every 0800 and Luvox 100 mg along with trazodone 100 mg every 2200.  Thank you for your attention to these are all psychiatric treatment needs and please contact me for questions or clarifications. Sincerely,   Chauncey Mann, MD

## 2018-07-07 NOTE — Telephone Encounter (Signed)
Tracey Gibson called to request a letter/note for work to excuse her from working 2nd shift.  They are calling her back to work Monday which she is ok with, except they want her to work 2nd shift.  She is afraid of anxiety/panic attack flare ups due to 2nd shift and her main support comes from family and if she works 2nd shift she will lose that support which will exacerbate her condition.  If she has a doctors note supporting her claim that working 2nd shift will not be good for her mental condition, they will try to work with her to leave her on 1st shift.  Needs ASAP.  If there is a way to post to her mychart, she can then print it off and give to her employer.

## 2018-07-07 NOTE — Telephone Encounter (Signed)
Medically necessary content of work excuse is formulated in [employer] medical record.

## 2018-07-07 NOTE — Progress Notes (Signed)
  Jul 07, 2018   Renelda S.  Kenkel [address] [place], [place] [address]  To Whom It May Concern:  From last appointment in my office June 12, 2018, I medically recommend that you sustain your dayshift work schedule rather than changing to evening shift relative to the chronobiology of current diagnoses of Obsessive-Compulsive Disorder F42.8/Attention Deficit Hyperactivity Disorder F90.0, Chronic Posttraumatic Stress Disorder F43.12, and Persistent Depressive Disorder F34.1.  We have no other interventions available from neurology consultation and Polysomnography arranged by primary care physician who continues active assessment and care.  Medications are structured as Adderall 20 mg IR at 0800 and Luvox 100 mg and trazodone 100 mg at 2200 to optimally integrate neuropsychiatric functions with responsibilities and structured rest.  Thank you for your attention to these psychiatric needs and please contact me for questions or clarifications. Sincerely,   Chauncey Mann, MD

## 2018-07-10 ENCOUNTER — Telehealth (INDEPENDENT_AMBULATORY_CARE_PROVIDER_SITE_OTHER): Payer: No Typology Code available for payment source | Admitting: Family Medicine

## 2018-07-10 ENCOUNTER — Encounter: Payer: Self-pay | Admitting: Family Medicine

## 2018-07-10 DIAGNOSIS — M545 Low back pain: Secondary | ICD-10-CM

## 2018-07-10 DIAGNOSIS — G8929 Other chronic pain: Secondary | ICD-10-CM | POA: Diagnosis not present

## 2018-07-10 NOTE — Assessment & Plan Note (Signed)
-  Fairly stable at this time, she will continue HEP.  -Will write letter of accomodation for her job, recommend limited lifting, bending, squatting, stooping or prolonged standing.  -She will let me know if this flares up again.

## 2018-07-10 NOTE — Progress Notes (Signed)
Tracey Gibson - 28 y.o. female MRN [id number]  Date of birth: 10-19-1990   This visit type was conducted due to national recommendations for restrictions regarding the COVID-19 Pandemic (e.g. social distancing).  This format is felt to be most appropriate for this patient at this time.  All issues noted in this document were discussed and addressed.  No physical exam was performed (except for noted visual exam findings with Video Visits).  I discussed the limitations of evaluation and management by telemedicine and the availability of in person appointments. The patient expressed understanding and agreed to proceed.  I connected with@ on 07/10/18 at  9:00 AM EDT by a video enabled telemedicine application and verified that I am speaking with the correct person using two identifiers.   Patient Location: Home [address] [place] [address]   Provider location:   [facility]  Chief Complaint  Patient presents with  . Back Pain    New duties at work , need note for work to be excused, sees Dr. Jordan Likes    HPI  Tracey Gibson is a 28 y.o. female who presents via audio/video conferencing for a telehealth visit today.  She is following up today for back pain.  She has history of chronic back pain that flares up from time to time.  She was seeing Dr. Jordan Likes for this previously and was referred to PT.  PT was helpful however was too expensive.  She has continued HEP.  She reports she will be returning to work after being off for a prolonged period due to COVID closures.  She states that they are asking her to return to a more physical job rather than her previous position.  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  She is concerned that this will cause issues with her back as it has in the past.  She is requesting a letter of accomodation/restrictions so that she may continue in her previous position or something similar.  She reports today that pain is manageable.  She  denies radiation of pain, numbness or tingling.    ROS:  A comprehensive ROS was completed and negative except as noted per HPI  Past Medical History:  Diagnosis Date  . ADHD   . Anxiety   . Depression     Past Surgical History:  Procedure Laterality Date  . WISDOM TOOTH EXTRACTION      Family History  Problem Relation Age of Onset  . Alcoholism Maternal Grandmother   . Suicidality Maternal Grandfather   . ADD / ADHD Maternal Aunt   . Bipolar disorder Maternal Aunt   . Alcohol abuse Maternal Aunt     Social History   Socioeconomic History  . Marital status: Single    Spouse name: Not on file  . Number of children: Not on file  . Years of education: Not on file  . Highest education level: Not on file  Occupational History  . Not on file  Social Needs  . Financial resource strain: Not on file  . Food insecurity:    Worry: Not on file    Inability: Not on file  . Transportation needs:    Medical: Not on file    Non-medical: Not on file  Tobacco Use  . Smoking status: Never Smoker  . Smokeless tobacco: Never Used  Substance and Sexual Activity  . Alcohol use: Yes  . Drug use: Not Currently    Types: Marijuana  . Sexual activity:  Yes    Birth control/protection: I.U.D.  Lifestyle  . Physical activity:    Days per week: Not on file    Minutes per session: Not on file  . Stress: Not on file  Relationships  . Social connections:    Talks on phone: Not on file    Gets together: Not on file    Attends religious service: Not on file    Active member of club or organization: Not on file    Attends meetings of clubs or organizations: Not on file    Relationship status: Not on file  . Intimate partner violence:    Fear of current or ex partner: Not on file    Emotionally abused: Not on file    Physically abused: Not on file    Forced sexual activity: Not on file  Other Topics Concern  . Not on file  Social History Narrative  . Not on file     Current  Outpatient Medications:  .  amphetamine-dextroamphetamine (ADDERALL) 20 MG tablet, Take 1 tablet (20 mg total) by mouth daily after breakfast for 30 days., Disp: 30 tablet, Rfl: 0 .  [START ON 07/12/2018] amphetamine-dextroamphetamine (ADDERALL) 20 MG tablet, Take 1 tablet (20 mg total) by mouth daily after breakfast for 30 days., Disp: 30 tablet, Rfl: 0 .  [START ON 08/11/2018] amphetamine-dextroamphetamine (ADDERALL) 20 MG tablet, Take 1 tablet (20 mg total) by mouth daily after breakfast for 30 days., Disp: 30 tablet, Rfl: 0 .  cetirizine (ZYRTEC) 10 MG tablet, Take 1 tablet (10 mg total) by mouth daily., Disp: 30 tablet, Rfl: 11 .  cyclobenzaprine (FLEXERIL) 10 MG tablet, Take 10 mg by mouth 3 (three) times daily., Disp: , Rfl: 0 .  fluvoxaMINE (LUVOX) 100 MG tablet, Take 1 tablet (100 mg total) by mouth at bedtime., Disp: 90 tablet, Rfl: 1 .  meloxicam (MOBIC) 15 MG tablet, TAKE 1 TABLET EVERY DAY WITH FOOD, Disp: , Rfl: 0 .  PARAGARD INTRAUTERINE COPPER IU, by Intrauterine route., Disp: , Rfl:  .  traZODone (DESYREL) 100 MG tablet, Take 1 tablet (100 mg total) by mouth at bedtime., Disp: 90 tablet, Rfl: 1 .  sodium chloride (OCEAN) 0.65 % SOLN nasal spray, Place 2 sprays into both nostrils every 2 (two) hours while awake for 30 days., Disp: , Rfl: 0  EXAM:  VITALS per patient if applicable: Pulse 80 Comment: smart watch  Ht 5\' 7"  (1.702 m)   Wt 180 lb (81.6 kg)   BMI 28.19 kg/m   GENERAL: alert, oriented, appears well and in no acute distress  HEENT: atraumatic, conjunttiva clear, no obvious abnormalities on inspection of external nose and ears  NECK: normal movements of the head and neck  LUNGS: on inspection no signs of respiratory distress, breathing rate appears normal, no obvious gross SOB, gasping or wheezing  CV: no obvious cyanosis  MS: moves all visible extremities without noticeable abnormality  PSYCH/NEURO: pleasant and cooperative, no obvious depression or anxiety,  speech and thought processing grossly intact  ASSESSMENT AND PLAN:  Discussed the following assessment and plan:  Chronic low back pain without sciatica -Fairly stable at this time, she will continue HEP.  -Will write letter of accomodation for her job, recommend limited lifting, bending, squatting, stooping or prolonged standing.  -She will let me know if this flares up again.        I discussed the assessment and treatment plan with the patient. The patient was provided an opportunity to ask questions and  all were answered. The patient agreed with the plan and demonstrated an understanding of the instructions.   The patient was advised to call back or seek an in-person evaluation if the symptoms worsen or if the condition fails to improve as anticipated.    Everrett Coombeody Raydan Schlabach, DO

## 2018-07-12 ENCOUNTER — Encounter: Payer: Self-pay | Admitting: Family Medicine

## 2018-07-14 ENCOUNTER — Telehealth: Payer: Self-pay

## 2018-07-14 NOTE — Telephone Encounter (Signed)
Copied from CRM [id number]. Topic: General - Inquiry >> Jul 14, 2018 11:22 AM Deborha Payment wrote: Reason for CRM: patient asked PCP for a back to work letter 5/13, Patient is still waiting for letter, She is okay for the letter to be sent on mychart, Call back # [phone]

## 2018-07-17 NOTE — Telephone Encounter (Signed)
I think that would be implied in the letter as she is not to stand >30 minutes per hour.   Would not recommend that she be completely sedentary though and sit in chair all day as this will weaken core muscles and contribute to chronic back pain.

## 2018-07-17 NOTE — Telephone Encounter (Signed)
Tried to call Tracey Gibson . LAM about Dr. Ashley Royalty recommendation about not sitting in a chair all day long.

## 2018-07-17 NOTE — Telephone Encounter (Signed)
Patient is calling to check on status of status of back to work letter.  Patient also would like to know if PCP could include to put in that she needs a chair to sit down.  Patient back [phone]

## 2018-07-17 NOTE — Telephone Encounter (Signed)
Letter completed, should be available through mychart.  If unable to access please print for pick up or fax.

## 2018-09-05 ENCOUNTER — Other Ambulatory Visit: Payer: Self-pay | Admitting: Psychiatry

## 2018-09-05 DIAGNOSIS — F4312 Post-traumatic stress disorder, chronic: Secondary | ICD-10-CM

## 2018-09-05 DIAGNOSIS — F341 Dysthymic disorder: Secondary | ICD-10-CM

## 2018-11-10 ENCOUNTER — Telehealth: Payer: Self-pay | Admitting: Psychiatry

## 2018-11-10 NOTE — Telephone Encounter (Signed)
Pt called to ask if Dr. Creig Hines would write a letter stating she needs her dog as a support animal?

## 2018-11-13 NOTE — Telephone Encounter (Signed)
Letter requested is completed for patient pickup as 2 copies with reference copy on office paper chart.

## 2018-11-23 ENCOUNTER — Telehealth: Payer: Self-pay | Admitting: Psychiatry

## 2018-11-23 NOTE — Telephone Encounter (Signed)
Phone call received at the office from [facility] [phone] instructing me to confirm my letter of 11/13/2018 written to medically certify the necessity for animal support for [facility].  Before I answered this phone request, I contacted [place] who gives me consent and direction to return this phone call, though not knowing to whom I am calling.  When I reach this number, the answering machine identifies them to be an emotional support animal service as they give directions for online communication if needed.  I simply verified by message left as instructed the letter I had written and the consent she gave me to leave that information about her.

## 2018-12-06 ENCOUNTER — Other Ambulatory Visit: Payer: Self-pay

## 2018-12-06 DIAGNOSIS — Z20822 Contact with and (suspected) exposure to covid-19: Secondary | ICD-10-CM

## 2018-12-07 LAB — NOVEL CORONAVIRUS, NAA: SARS-CoV-2, NAA: NOT DETECTED

## 2019-03-03 ENCOUNTER — Other Ambulatory Visit: Payer: Self-pay | Admitting: Psychiatry

## 2019-03-03 DIAGNOSIS — F341 Dysthymic disorder: Secondary | ICD-10-CM

## 2019-03-03 DIAGNOSIS — F4312 Post-traumatic stress disorder, chronic: Secondary | ICD-10-CM

## 2019-03-04 NOTE — Telephone Encounter (Signed)
Last seen in April missing her 73-month appointment as no-show after interim phone requirement for support animal verification professionally sending interim 30-day supply of Luvox 100 mg nightly and trazodone 100 mg nightly to [facility] until makes appointment.

## 2019-03-04 NOTE — Telephone Encounter (Signed)
Last apt 06/12/2018 due back 6 months.

## 2019-04-12 ENCOUNTER — Other Ambulatory Visit: Payer: Self-pay | Admitting: Psychiatry

## 2019-04-12 DIAGNOSIS — F4312 Post-traumatic stress disorder, chronic: Secondary | ICD-10-CM

## 2019-04-12 DIAGNOSIS — F341 Dysthymic disorder: Secondary | ICD-10-CM

## 2019-04-12 NOTE — Telephone Encounter (Signed)
4 months overdue for committed follow-up having last fill of Adderall 4 months ago requesting to continue Luvox until she comes in appointment reminder included with a 30-day supply of 100 mg every bedtime

## 2019-04-12 NOTE — Telephone Encounter (Signed)
No visit scheduled

## 2019-04-18 ENCOUNTER — Other Ambulatory Visit: Payer: Self-pay

## 2019-04-18 ENCOUNTER — Encounter: Payer: Self-pay | Admitting: Psychiatry

## 2019-04-18 ENCOUNTER — Ambulatory Visit (INDEPENDENT_AMBULATORY_CARE_PROVIDER_SITE_OTHER): Payer: 59 | Admitting: Psychiatry

## 2019-04-18 VITALS — Ht 69.0 in | Wt 191.0 lb

## 2019-04-18 DIAGNOSIS — F4312 Post-traumatic stress disorder, chronic: Secondary | ICD-10-CM | POA: Diagnosis not present

## 2019-04-18 DIAGNOSIS — F428 Other obsessive-compulsive disorder: Secondary | ICD-10-CM

## 2019-04-18 DIAGNOSIS — F900 Attention-deficit hyperactivity disorder, predominantly inattentive type: Secondary | ICD-10-CM | POA: Diagnosis not present

## 2019-04-18 DIAGNOSIS — F341 Dysthymic disorder: Secondary | ICD-10-CM | POA: Diagnosis not present

## 2019-04-18 MED ORDER — TRAZODONE HCL 100 MG PO TABS: 100.0000 mg | ORAL_TABLET | Freq: Every evening | ORAL | 2 refills | Status: DC | PRN

## 2019-04-18 MED ORDER — AMPHETAMINE-DEXTROAMPHETAMINE 20 MG PO TABS: 20.0000 mg | ORAL_TABLET | Freq: Every day | ORAL | 0 refills | Status: DC

## 2019-04-18 MED ORDER — FLUVOXAMINE MALEATE 100 MG PO TABS: 100.0000 mg | ORAL_TABLET | Freq: Every day | ORAL | 2 refills | Status: DC
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
## 2019-05-02 ENCOUNTER — Other Ambulatory Visit: Payer: Self-pay | Admitting: Psychiatry

## 2019-05-02 DIAGNOSIS — F341 Dysthymic disorder: Secondary | ICD-10-CM

## 2019-05-02 DIAGNOSIS — F4312 Post-traumatic stress disorder, chronic: Secondary | ICD-10-CM

## 2019-06-21 ENCOUNTER — Encounter: Payer: Self-pay | Admitting: Family Medicine

## 2019-06-21 ENCOUNTER — Telehealth (INDEPENDENT_AMBULATORY_CARE_PROVIDER_SITE_OTHER): Payer: Managed Care, Other (non HMO) | Admitting: Family Medicine

## 2019-06-21 ENCOUNTER — Other Ambulatory Visit: Payer: Self-pay

## 2019-06-21 VITALS — Temp 98.9°F | Ht 69.0 in | Wt 178.0 lb

## 2019-06-21 DIAGNOSIS — J020 Streptococcal pharyngitis: Secondary | ICD-10-CM | POA: Diagnosis not present

## 2019-06-21 MED ORDER — AMOXICILLIN 500 MG PO CAPS: 500.0000 mg | ORAL_CAPSULE | Freq: Two times a day (BID) | ORAL | 0 refills | Status: AC

## 2019-06-21 NOTE — Progress Notes (Signed)
Virtual Visit via Video Note  I connected with Tracey Gibson on 06/21/19 at  4:00 PM EDT by a video enabled telemedicine application and verified that I am speaking with the correct person using two identifiers. Location patient: home Location provider: work Persons participating in the virtual visit: patient, provider  I discussed the limitations of evaluation and management by telemedicine and the availability of in person appointments. The patient expressed understanding and agreed to proceed.  Chief Complaint  Patient presents with  . Sore Throat    pt states sore throat since Monday, she did notice a few white spots on throat and low grade fever last night, lymph nodes in neck swollen, tried extra strength tyelonel     HPI: Tracey Gibson is a 29 y.o. female who complains of sore throat, pain with swallowing, feels like the LN in her neck are swollen, low grade fever (Tmax 100.9). symptoms x 3 days, getting more painful. No cough, rhinorrhea, nasal congestion, SOB, ear pain, chills. Pt states she can see white spots in the back of her throat. Pt has taken tylenol w/ minimal relief.    Past Medical History:  Diagnosis Date  . ADHD   . Anxiety   . Depression     Past Surgical History:  Procedure Laterality Date  . WISDOM TOOTH EXTRACTION      Family History  Problem Relation Age of Onset  . Alcoholism Maternal Grandmother   . Suicidality Maternal Grandfather   . ADD / ADHD Maternal Aunt   . Bipolar disorder Maternal Aunt   . Alcohol abuse Maternal Aunt     Social History   Tobacco Use  . Smoking status: Never Smoker  . Smokeless tobacco: Never Used  Substance Use Topics  . Alcohol use: Yes  . Drug use: Not Currently    Types: Marijuana     Current Outpatient Medications:  .  amphetamine-dextroamphetamine (ADDERALL) 20 MG tablet, Take 1 tablet (20 mg total) by mouth daily after breakfast., Disp: 30 tablet, Rfl: 0 .  amphetamine-dextroamphetamine (ADDERALL) 20  MG tablet, Take 1 tablet (20 mg total) by mouth daily after breakfast., Disp: 30 tablet, Rfl: 0 .  amphetamine-dextroamphetamine (ADDERALL) 20 MG tablet, Take 1 tablet (20 mg total) by mouth daily after breakfast., Disp: 30 tablet, Rfl: 0 .  cetirizine (ZYRTEC) 10 MG tablet, Take 1 tablet (10 mg total) by mouth daily., Disp: 30 tablet, Rfl: 11 .  cyclobenzaprine (FLEXERIL) 10 MG tablet, Take 10 mg by mouth 3 (three) times daily., Disp: , Rfl: 0 .  fluvoxaMINE (LUVOX) 100 MG tablet, Take 1 tablet (100 mg total) by mouth at bedtime., Disp: 90 tablet, Rfl: 2 .  meloxicam (MOBIC) 15 MG tablet, TAKE 1 TABLET EVERY DAY WITH FOOD, Disp: , Rfl: 0 .  PARAGARD INTRAUTERINE COPPER IU, by Intrauterine route., Disp: , Rfl:  .  traZODone (DESYREL) 100 MG tablet, Take 1 tablet (100 mg total) by mouth at bedtime as needed for sleep., Disp: 90 tablet, Rfl: 2 .  amoxicillin (AMOXIL) 500 MG capsule, Take 1 capsule (500 mg total) by mouth 2 (two) times daily for 10 days., Disp: 20 capsule, Rfl: 0 .  sodium chloride (OCEAN) 0.65 % SOLN nasal spray, Place 2 sprays into both nostrils every 2 (two) hours while awake for 30 days. (Patient not taking: Reported on 06/21/2019), Disp: , Rfl: 0  No Known Allergies    ROS: See pertinent positives and negatives per HPI.   EXAM:  VITALS per patient if  applicable: Temp 98.9 F (37.2 C) (Oral) Comment: pt reported  Ht 5\' 9"  (1.753 m)   Wt 178 lb (80.7 kg) Comment: pt reported  BMI 26.29 kg/m    GENERAL: alert, oriented, appears well and in no acute distress  HEENT: atraumatic, conjunctiva clear, no obvious abnormalities on inspection of external nose and ears  NECK: normal movements of the head and neck  LUNGS: on inspection no signs of respiratory distress, breathing rate appears normal, no obvious gross SOB, gasping or wheezing, no conversational dyspnea  CV: no obvious cyanosis  PSYCH/NEURO: pleasant and cooperative, speech and thought processing grossly  intact   ASSESSMENT AND PLAN:  1. Strep pharyngitis - cont w/ supportive care - tylenol or ibuprofen PRN, salt water gargles, lozenges and/or throat spray Rx: - amoxicillin (AMOXIL) 500 MG capsule; Take 1 capsule (500 mg total) by mouth 2 (two) times daily for 10 days.  Dispense: 20 capsule; Refill: 0 - f/u if symptoms worsen or do not improve in 7-10 days Discussed plan and reviewed medications with patient, including risks, benefits, and potential side effects. Pt expressed understand. All questions answered.   I discussed the assessment and treatment plan with the patient. The patient was provided an opportunity to ask questions and all were answered. The patient agreed with the plan and demonstrated an understanding of the instructions.   The patient was advised to call back or seek an in-person evaluation if the symptoms worsen or if the condition fails to improve as anticipated.   9-10, DO

## 2019-08-08 ENCOUNTER — Other Ambulatory Visit: Payer: Self-pay

## 2019-08-09 ENCOUNTER — Encounter: Payer: Self-pay | Admitting: Family Medicine

## 2019-08-09 ENCOUNTER — Ambulatory Visit (INDEPENDENT_AMBULATORY_CARE_PROVIDER_SITE_OTHER): Payer: Managed Care, Other (non HMO) | Admitting: Family Medicine

## 2019-08-09 VITALS — BP 110/80 | HR 87 | Temp 97.5°F | Ht 67.0 in | Wt 183.6 lb

## 2019-08-09 DIAGNOSIS — Z0000 Encounter for general adult medical examination without abnormal findings: Secondary | ICD-10-CM

## 2019-08-09 DIAGNOSIS — R0683 Snoring: Secondary | ICD-10-CM

## 2019-08-09 DIAGNOSIS — F900 Attention-deficit hyperactivity disorder, predominantly inattentive type: Secondary | ICD-10-CM | POA: Diagnosis not present

## 2019-08-09 DIAGNOSIS — F428 Other obsessive-compulsive disorder: Secondary | ICD-10-CM | POA: Diagnosis not present

## 2019-08-09 DIAGNOSIS — Z1321 Encounter for screening for nutritional disorder: Secondary | ICD-10-CM

## 2019-08-09 NOTE — Progress Notes (Signed)
Tracey Gibson is a 29 y.o. female  Chief Complaint  Patient presents with  . Transitions Of Care    Patient is here today to transfer care from Dr. Zigmund Daniel to Dr. Bryan Lemma.  . Annual Exam    She is also here today for a CPE. Immunizations are UTD.   pt is fasting for lab work.  Pt is UTD on pap smear, [facility].  Pt would like to discuss snoring options, she has recently had a sleep study.    HPI: Tracey Gibson is a 29 y.o. female here for annual CPE, fasting labs.   Pt has sleep study in 04/2018 - no significant OSA, mild snoring. Pt states she snores loudly and it wakes friends or boyfriend up at times. She sleeps on her side and back and believes she snores in either position. She has tried breath right strips without improvement.  Last PAP:  UTD - follows with GYN at [facility]  Diet/Exercise: working to improve diet, exercises regularly since 03/2019 - she has 15lbs  Dentist: overdue Vision: no vision issues/concerns, does not wear glasses  Med refills needed today? n/a   Past Medical History:  Diagnosis Date  . ADHD   . Anxiety   . Depression     Past Surgical History:  Procedure Laterality Date  . WISDOM TOOTH EXTRACTION      Social History   Socioeconomic History  . Marital status: Single    Spouse name: Not on file  . Number of children: Not on file  . Years of education: Not on file  . Highest education level: Not on file  Occupational History  . Not on file  Tobacco Use  . Smoking status: Never Smoker  . Smokeless tobacco: Never Used  Vaping Use  . Vaping Use: Former  Substance and Sexual Activity  . Alcohol use: Yes  . Drug use: Not Currently    Types: Marijuana  . Sexual activity: Yes    Birth control/protection: I.U.D.  Other Topics Concern  . Not on file  Social History Narrative  . Not on file   Social Determinants of Health   Financial Resource Strain:   . Difficulty of Paying Living Expenses:   Food  Insecurity:   . Worried About Charity fundraiser in the Last Year:   . Arboriculturist in the Last Year:   Transportation Needs:   . Film/video editor (Medical):   Marland Kitchen Lack of Transportation (Non-Medical):   Physical Activity:   . Days of Exercise per Week:   . Minutes of Exercise per Session:   Stress:   . Feeling of Stress :   Social Connections:   . Frequency of Communication with Friends and Family:   . Frequency of Social Gatherings with Friends and Family:   . Attends Religious Services:   . Active Member of Clubs or Organizations:   . Attends Archivist Meetings:   Marland Kitchen Marital Status:   Intimate Partner Violence:   . Fear of Current or Ex-Partner:   . Emotionally Abused:   Marland Kitchen Physically Abused:   . Sexually Abused:     Family History  Problem Relation Age of Onset  . Alcoholism Maternal Grandmother   . Suicidality Maternal Grandfather   . ADD / ADHD Maternal Aunt   . Bipolar disorder Maternal Aunt   . Alcohol abuse Maternal Aunt      Immunization History  Administered Date(s) Administered  . DTaP  09/30/1992, 12/03/1992, 02/02/1993, 11/20/1993, 08/21/1997  . HPV Bivalent 11/14/2006, 09/23/2014  . Hepatitis A 04/22/2006, 11/14/2006  . Hepatitis B 01[birth year]27[birth year]93, 09/30/1992, 08/21/1997  . HiB (PRP-OMP) 09/30/1992, 12/03/1992, 02/02/1993, 11/20/1993  . IPV 09/30/1992, 12/03/1992, 02/02/1993, 08/21/1997  . Influenza,inj,Quad PF,6+ Mos 01/03/2017, 01/30/2018  . Influenza,inj,quad, With Preservative 11/19/2013  . MMR 11/20/1993, 08/21/1997  . PFIZER SARS-COV-2 Vaccination 06/14/2019, 07/05/2019  . PPD Test 05/18/2017  . Tdap 04/22/2006, 03/01/2012, 01/30/2018  . Typhoid Inactivated 04/22/2006  . Varicella 02/12/1997, 04/22/2006  . Yellow Fever 04/22/2006    Outpatient Encounter Medications as of 08/09/2019  Medication Sig Note  . cetirizine (ZYRTEC) 10 MG tablet Take 1 tablet (10 mg total) by mouth daily.   . cyclobenzaprine (FLEXERIL) 10 MG tablet Take  10 mg by mouth 3 (three) times daily.   . fluvoxaMINE (LUVOX) 100 MG tablet Take 1 tablet (100 mg total) by mouth at bedtime.   . meloxicam (MOBIC) 15 MG tablet TAKE 1 TABLET EVERY DAY WITH FOOD 04/13/2018: Takes prn  . PARAGARD INTRAUTERINE COPPER IU by Intrauterine route.   . traZODone (DESYREL) 100 MG tablet Take 1 tablet (100 mg total) by mouth at bedtime as needed for sleep.   Marland Kitchen amphetamine-dextroamphetamine (ADDERALL) 20 MG tablet Take 1 tablet (20 mg total) by mouth daily after breakfast.   . sodium chloride (OCEAN) 0.65 % SOLN nasal spray Place 2 sprays into both nostrils every 2 (two) hours while awake for 30 days. (Patient not taking: Reported on 06/21/2019)   . [DISCONTINUED] amphetamine-dextroamphetamine (ADDERALL) 20 MG tablet Take 1 tablet (20 mg total) by mouth daily after breakfast.   . [DISCONTINUED] amphetamine-dextroamphetamine (ADDERALL) 20 MG tablet Take 1 tablet (20 mg total) by mouth daily after breakfast.    No facility-administered encounter medications on file as of 08/09/2019.     ROS: Gen: no fever, chills  Skin: no rash, itching ENT: no ear pain, ear drainage, nasal congestion, rhinorrhea, sinus pressure, sore throat Eyes: no blurry vision, double vision Resp: no cough, wheeze,SOB CV: no CP, palpitations, LE edema,  GI: no heartburn, n/v/d/c, abd pain GU: no dysuria, urgency, frequency, hematuria MSK: no joint pain, myalgias, back pain Neuro: no dizziness, headache, weakness, vertigo   No Known Allergies  BP 110/80 (BP Location: Left Arm, Patient Position: Sitting, Cuff Size: Normal)   Pulse 87   Temp (!) 97.5 F (36.4 C) (Temporal)   Ht '5\' 7"'$  (1.702 m)   Wt 183 lb 9.6 oz (83.3 kg)   LMP 07/27/2019   SpO2 98%   BMI 28.76 kg/m   Physical Exam Constitutional:      General: She is not in acute distress.    Appearance: She is well-developed.  HENT:     Head: Normocephalic and atraumatic.     Right Ear: Tympanic membrane and ear canal normal.      Left Ear: Tympanic membrane and ear canal normal.     Nose: Nose normal.  Eyes:     Conjunctiva/sclera: Conjunctivae normal.     Pupils: Pupils are equal, round, and reactive to light.  Neck:     Thyroid: No thyromegaly.  Cardiovascular:     Rate and Rhythm: Normal rate and regular rhythm.     Heart sounds: Normal heart sounds. No murmur heard.   Pulmonary:     Effort: Pulmonary effort is normal. No respiratory distress.     Breath sounds: Normal breath sounds. No wheezing or rhonchi.  Abdominal:     General: Bowel sounds are normal. There is  no distension.     Palpations: Abdomen is soft. There is no mass.     Tenderness: There is no abdominal tenderness.  Musculoskeletal:     Cervical back: Neck supple.  Lymphadenopathy:     Cervical: No cervical adenopathy.  Skin:    General: Skin is warm and dry.  Neurological:     Mental Status: She is alert and oriented to person, place, and time.     Motor: No abnormal muscle tone.     Coordination: Coordination normal.  Psychiatric:        Behavior: Behavior normal.      A/P:  1. Annual physical exam - due for dental exam - discussed importance of regular CV exercise, healthy diet, adequate sleep - pt has intentionally lost 15llbs! - immunizations UTD - PAP UTD - ALT; Future - AST; Future - Basic metabolic panel; Future - CBC; Future - Lipid panel; Future  2. Other obsessive-compulsive disorders - stable - cont current meds and routine f/u with Dr. Creig Hines  3. Attention deficit hyperactivity disorder (ADHD), inattentive type, moderate - stable - cont current meds and routine f/u with Dr. Creig Hines  4. Snoring - no OSA on sleep study - pt has tried OTC items like Breath Right strips without improvement - pt has lost 15lbs with diet and exercise and hopes further weight loss will help snoring - could consider ENT referral but pt isn't sure she wants to proceed with this, will let me know if she does  5. Encounter for  vitamin deficiency screening - VITAMIN D 25 Hydroxy (Vit-D Deficiency, Fractures); Future   This visit occurred during the SARS-CoV-2 public health emergency.  Safety protocols were in place, including screening questions prior to the visit, additional usage of staff PPE, and extensive cleaning of exam room while observing appropriate contact time as indicated for disinfecting solutions.

## 2019-08-14 ENCOUNTER — Other Ambulatory Visit: Payer: Managed Care, Other (non HMO)

## 2019-09-17 ENCOUNTER — Other Ambulatory Visit: Payer: Self-pay

## 2019-09-17 ENCOUNTER — Telehealth: Payer: Self-pay | Admitting: Psychiatry

## 2019-09-17 DIAGNOSIS — F900 Attention-deficit hyperactivity disorder, predominantly inattentive type: Secondary | ICD-10-CM

## 2019-09-17 MED ORDER — AMPHETAMINE-DEXTROAMPHETAMINE 20 MG PO TABS: 20.0000 mg | ORAL_TABLET | Freq: Every day | ORAL | 0 refills | Status: DC

## 2019-09-17 NOTE — Telephone Encounter (Signed)
[place] registry extends over 10 months all appropriate due for Adderall 20 mg IR every morning medically necessary no contraindication

## 2019-09-17 NOTE — Telephone Encounter (Signed)
Last refill 08/01/2019 Next apt 10/16/2019 Pended 1 Rx for Dr. Marlyne Beards to review and send

## 2019-09-17 NOTE — Telephone Encounter (Signed)
Pt would like a refill on Adderall. Please send to [facility] on [address].

## 2019-10-16 ENCOUNTER — Ambulatory Visit: Payer: 59 | Admitting: Psychiatry

## 2019-10-23 ENCOUNTER — Ambulatory Visit (INDEPENDENT_AMBULATORY_CARE_PROVIDER_SITE_OTHER): Payer: 59 | Admitting: Psychiatry

## 2019-10-23 ENCOUNTER — Other Ambulatory Visit: Payer: Self-pay

## 2019-10-23 ENCOUNTER — Encounter: Payer: Self-pay | Admitting: Psychiatry

## 2019-10-23 VITALS — Ht 69.0 in | Wt 177.0 lb

## 2019-10-23 DIAGNOSIS — F900 Attention-deficit hyperactivity disorder, predominantly inattentive type: Secondary | ICD-10-CM | POA: Diagnosis not present

## 2019-10-23 DIAGNOSIS — F428 Other obsessive-compulsive disorder: Secondary | ICD-10-CM

## 2019-10-23 DIAGNOSIS — F341 Dysthymic disorder: Secondary | ICD-10-CM

## 2019-10-23 DIAGNOSIS — F4312 Post-traumatic stress disorder, chronic: Secondary | ICD-10-CM | POA: Diagnosis not present

## 2019-10-23 MED ORDER — AMPHETAMINE-DEXTROAMPHETAMINE 20 MG PO TABS: 20.0000 mg | ORAL_TABLET | Freq: Every day | ORAL | 0 refills | Status: DC

## 2019-10-23 MED ORDER — TRAZODONE HCL 100 MG PO TABS: 100.0000 mg | ORAL_TABLET | Freq: Every evening | ORAL | 6 refills | Status: DC | PRN

## 2019-10-23 MED ORDER — FLUVOXAMINE MALEATE 100 MG PO TABS: 100.0000 mg | ORAL_TABLET | Freq: Every day | ORAL | 6 refills | Status: DC
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
## 2019-11-09 ENCOUNTER — Telehealth: Payer: Self-pay | Admitting: Psychiatry

## 2019-11-09 NOTE — Telephone Encounter (Signed)
Pt needs an updated letter for 2021 for her apartment complex, [address] [place], that she needs an emotionally support animal for her anxiety. She will pick up.

## 2019-11-09 NOTE — Telephone Encounter (Signed)
Pt called and needs a letter for 2021 for her apartment complex, [address] [place], stating she needs an emotionally support animal for her anxiety. Needs addressed To Whom It May Concern. Tracey Gibson will pick letter up.

## 2019-11-12 DIAGNOSIS — Z0289 Encounter for other administrative examinations: Secondary | ICD-10-CM

## 2019-11-12 NOTE — Telephone Encounter (Signed)
Medical psychiatric certification and documentation prepared on office letterhead for patient pick up with copy on chart

## 2019-12-04 ENCOUNTER — Other Ambulatory Visit: Payer: Self-pay

## 2019-12-05 ENCOUNTER — Other Ambulatory Visit (INDEPENDENT_AMBULATORY_CARE_PROVIDER_SITE_OTHER): Payer: Managed Care, Other (non HMO)

## 2019-12-05 ENCOUNTER — Encounter: Payer: Self-pay | Admitting: Family Medicine

## 2019-12-05 ENCOUNTER — Telehealth (INDEPENDENT_AMBULATORY_CARE_PROVIDER_SITE_OTHER): Payer: Managed Care, Other (non HMO) | Admitting: Family Medicine

## 2019-12-05 ENCOUNTER — Other Ambulatory Visit (HOSPITAL_COMMUNITY)
Admission: RE | Admit: 2019-12-05 | Discharge: 2019-12-05 | Disposition: A | Discharge status: Home / Self Care | Payer: Managed Care, Other (non HMO) | Source: Ambulatory Visit | Attending: Family Medicine | Admitting: Family Medicine

## 2019-12-05 VITALS — Ht 69.0 in | Wt 172.0 lb

## 2019-12-05 DIAGNOSIS — Z0000 Encounter for general adult medical examination without abnormal findings: Secondary | ICD-10-CM

## 2019-12-05 DIAGNOSIS — Z113 Encounter for screening for infections with a predominantly sexual mode of transmission: Secondary | ICD-10-CM

## 2019-12-05 DIAGNOSIS — Z1321 Encounter for screening for nutritional disorder: Secondary | ICD-10-CM | POA: Diagnosis not present

## 2019-12-05 LAB — ALT: ALT: 16 U/L (ref 0–35)

## 2019-12-05 LAB — CBC
HCT: 39.7 % (ref 36.0–46.0)
Hemoglobin: 13.5 g/dL (ref 12.0–15.0)
MCHC: 34.1 g/dL (ref 30.0–36.0)
MCV: 93.6 fl (ref 78.0–100.0)
Platelets: 344 10*3/uL (ref 150.0–400.0)
RBC: 4.24 Mil/uL (ref 3.87–5.11)
RDW: 13.2 % (ref 11.5–15.5)
WBC: 7.2 10*3/uL (ref 4.0–10.5)

## 2019-12-05 LAB — LIPID PANEL
Cholesterol: 165 mg/dL (ref 0–200)
HDL: 60.4 mg/dL (ref >39.00)
LDL Cholesterol: 90 mg/dL (ref 0–99)
NonHDL: 104.11
Total CHOL/HDL Ratio: 3
Triglycerides: 72 mg/dL (ref 0.0–149.0)
VLDL: 14.4 mg/dL (ref 0.0–40.0)

## 2019-12-05 LAB — BASIC METABOLIC PANEL
BUN: 10 mg/dL (ref 6–23)
CO2: 26 mEq/L (ref 19–32)
Calcium: 9.2 mg/dL (ref 8.4–10.5)
Chloride: 100 mEq/L (ref 96–112)
Creatinine, Ser: 0.79 mg/dL (ref 0.40–1.20)
GFR: 102.34 mL/min (ref >60.00)
Glucose, Bld: 87 mg/dL (ref 70–99)
Potassium: 3.5 mEq/L (ref 3.5–5.1)
Sodium: 135 mEq/L (ref 135–145)

## 2019-12-05 LAB — VITAMIN D 25 HYDROXY (VIT D DEFICIENCY, FRACTURES): VITD: 48.06 ng/mL (ref 30.00–100.00)

## 2019-12-05 LAB — AST: AST: 16 U/L (ref 0–37)

## 2019-12-05 NOTE — Progress Notes (Signed)
Virtual Visit via Video Note  I connected with Tracey Gibson on 12/05/19 at  1:30 PM EDT by a video enabled telemedicine application and verified that I am speaking with the correct person using two identifiers. Location patient: home Location provider: work  Persons participating in the virtual visit: patient, provider  I discussed the limitations of evaluation and management by telemedicine and the availability of in person appointments. The patient expressed understanding and agreed to proceed.  Chief Complaint  Patient presents with  . Acute Visit    wants STD panel     HPI: Tracey Gibson is a 29 y.o. female who requests STD screening. She denies any symptoms.  Pt had intercourse about 3 weeks ago and pt removed condom without telling pt. She would like STD testing as a precautionary measure.   Past Medical History:  Diagnosis Date  . ADHD   . Anxiety   . Depression     Past Surgical History:  Procedure Laterality Date  . WISDOM TOOTH EXTRACTION      Family History  Problem Relation Age of Onset  . Alcoholism Maternal Grandmother   . Suicidality Maternal Grandfather   . ADD / ADHD Maternal Aunt   . Bipolar disorder Maternal Aunt   . Alcohol abuse Maternal Aunt     Social History   Tobacco Use  . Smoking status: Never Smoker  . Smokeless tobacco: Never Used  Vaping Use  . Vaping Use: Former  Substance Use Topics  . Alcohol use: Yes  . Drug use: Not Currently    Types: Marijuana     Current Outpatient Medications:  .  amphetamine-dextroamphetamine (ADDERALL) 20 MG tablet, Take 1 tablet (20 mg total) by mouth daily after breakfast., Disp: 30 tablet, Rfl: 0 .  [START ON 12/22/2019] amphetamine-dextroamphetamine (ADDERALL) 20 MG tablet, Take 1 tablet (20 mg total) by mouth daily after breakfast., Disp: 30 tablet, Rfl: 0 .  fluvoxaMINE (LUVOX) 100 MG tablet, Take 1 tablet (100 mg total) by mouth at bedtime., Disp: 30 tablet, Rfl: 6 .  PARAGARD INTRAUTERINE  COPPER IU, by Intrauterine route., Disp: , Rfl:  .  traZODone (DESYREL) 100 MG tablet, Take 1 tablet (100 mg total) by mouth at bedtime as needed for sleep., Disp: 30 tablet, Rfl: 6 .  amphetamine-dextroamphetamine (ADDERALL) 20 MG tablet, Take 1 tablet (20 mg total) by mouth daily after breakfast., Disp: 30 tablet, Rfl: 0 .  cetirizine (ZYRTEC) 10 MG tablet, Take 1 tablet (10 mg total) by mouth daily., Disp: 30 tablet, Rfl: 11 .  cyclobenzaprine (FLEXERIL) 10 MG tablet, Take 10 mg by mouth 3 (three) times daily., Disp: , Rfl: 0 .  meloxicam (MOBIC) 15 MG tablet, TAKE 1 TABLET EVERY DAY WITH FOOD, Disp: , Rfl: 0 .  sodium chloride (OCEAN) 0.65 % SOLN nasal spray, Place 2 sprays into both nostrils every 2 (two) hours while awake for 30 days. (Patient not taking: Reported on 06/21/2019), Disp: , Rfl: 0  No Known Allergies    ROS: See pertinent positives and negatives per HPI.   EXAM:  VITALS per patient if applicable: Ht 5\' 9"  (1.753 m)   Wt 172 lb (78 kg) Comment: pt reported  BMI 25.40 kg/m    GENERAL: alert, oriented, appears well and in no acute distress  NECK: normal movements of the head and neck  LUNGS: on inspection no signs of respiratory distress, breathing rate appears normal, no obvious gross SOB, gasping or wheezing, no conversational dyspnea  CV: no obvious  cyanosis  PSYCH/NEURO: pleasant and cooperative, no obvious depression or anxiety, speech and thought processing grossly intact   ASSESSMENT AND PLAN:  1. Screening for STD (sexually transmitted disease) - no known exposure or symptoms - pt will go to [place] lab to have labs done today, address given - Hepatitis B surface antigen; Future - HIV Antibody (routine testing w rflx); Future - RPR; Future - Hepatitis C Antibody; Future - Urine cytology ancillary only([place]); Future - will contact pt once results available - all questions answered   I discussed the assessment and treatment plan with the  patient. The patient was provided an opportunity to ask questions and all were answered. The patient agreed with the plan and demonstrated an understanding of the instructions.   The patient was advised to call back or seek an in-person evaluation if the symptoms worsen or if the condition fails to improve as anticipated.   Luana Shu, DO

## 2019-12-06 ENCOUNTER — Encounter: Payer: Self-pay | Admitting: Family Medicine

## 2019-12-06 LAB — RPR: RPR Ser Ql: NONREACTIVE

## 2019-12-06 LAB — URINE CYTOLOGY ANCILLARY ONLY
Chlamydia: NEGATIVE
Comment: NEGATIVE
Comment: NEGATIVE
Comment: NORMAL
Neisseria Gonorrhea: NEGATIVE
Trichomonas: NEGATIVE

## 2019-12-06 LAB — HEPATITIS C ANTIBODY
Hepatitis C Ab: NONREACTIVE
SIGNAL TO CUT-OFF: 0.01 (ref <1.00)

## 2019-12-06 LAB — HEPATITIS B SURFACE ANTIGEN: Hepatitis B Surface Ag: NONREACTIVE

## 2019-12-06 LAB — HIV ANTIBODY (ROUTINE TESTING W REFLEX): HIV 1&2 Ab, 4th Generation: NONREACTIVE

## 2019-12-19 ENCOUNTER — Encounter: Payer: Self-pay | Admitting: Psychiatry

## 2020-03-26 IMAGING — DX DG LUMBAR SPINE 2-3V
2 series · 2 of 2 positions shown · non-contrast
Comparison: None.

CLINICAL DATA: Low back pain for the past 3 months following heavy
lifting.

EXAM:
LUMBAR SPINE - 2-3 VIEW

[lumbar spine ap]
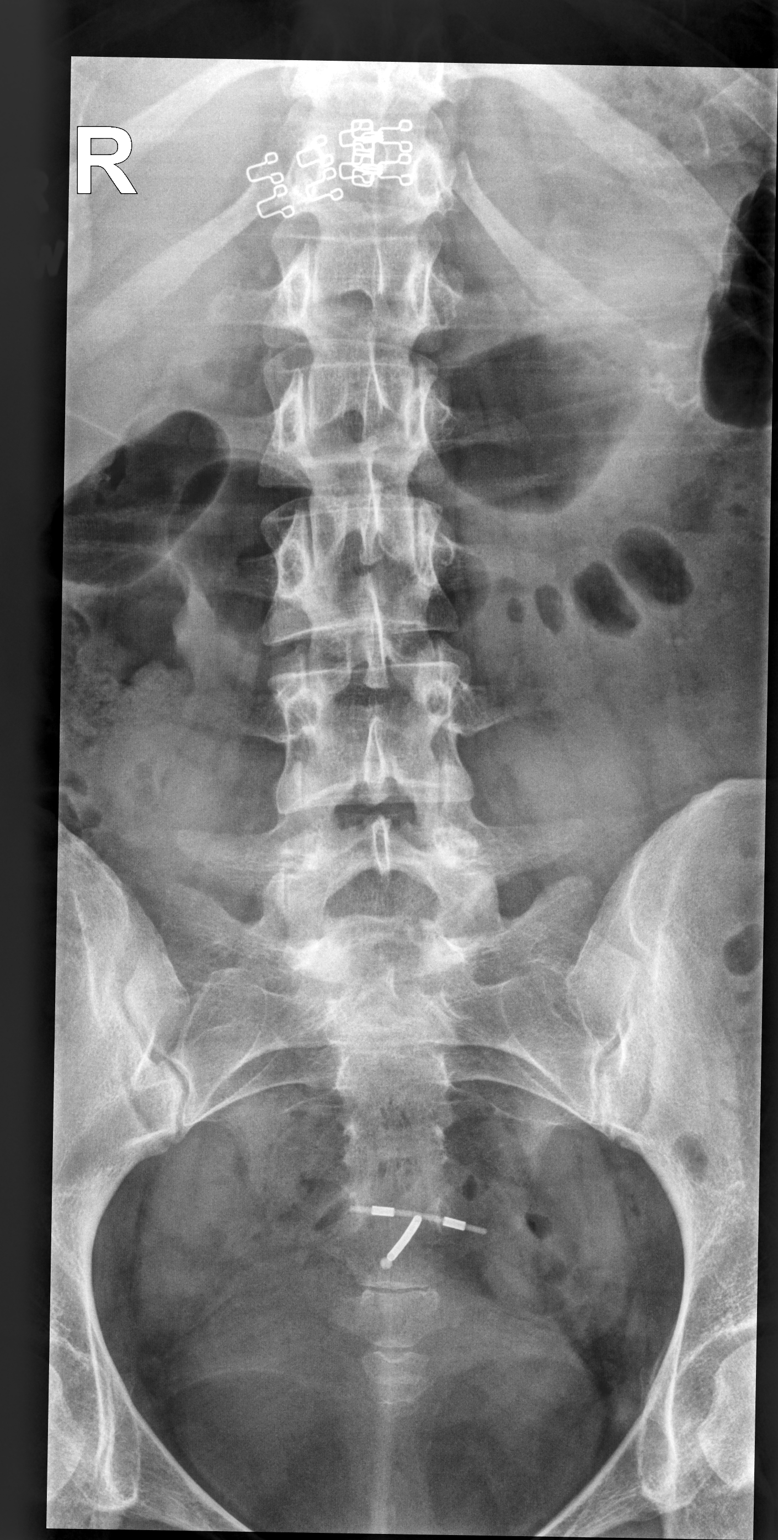

[lumbar spine lat]
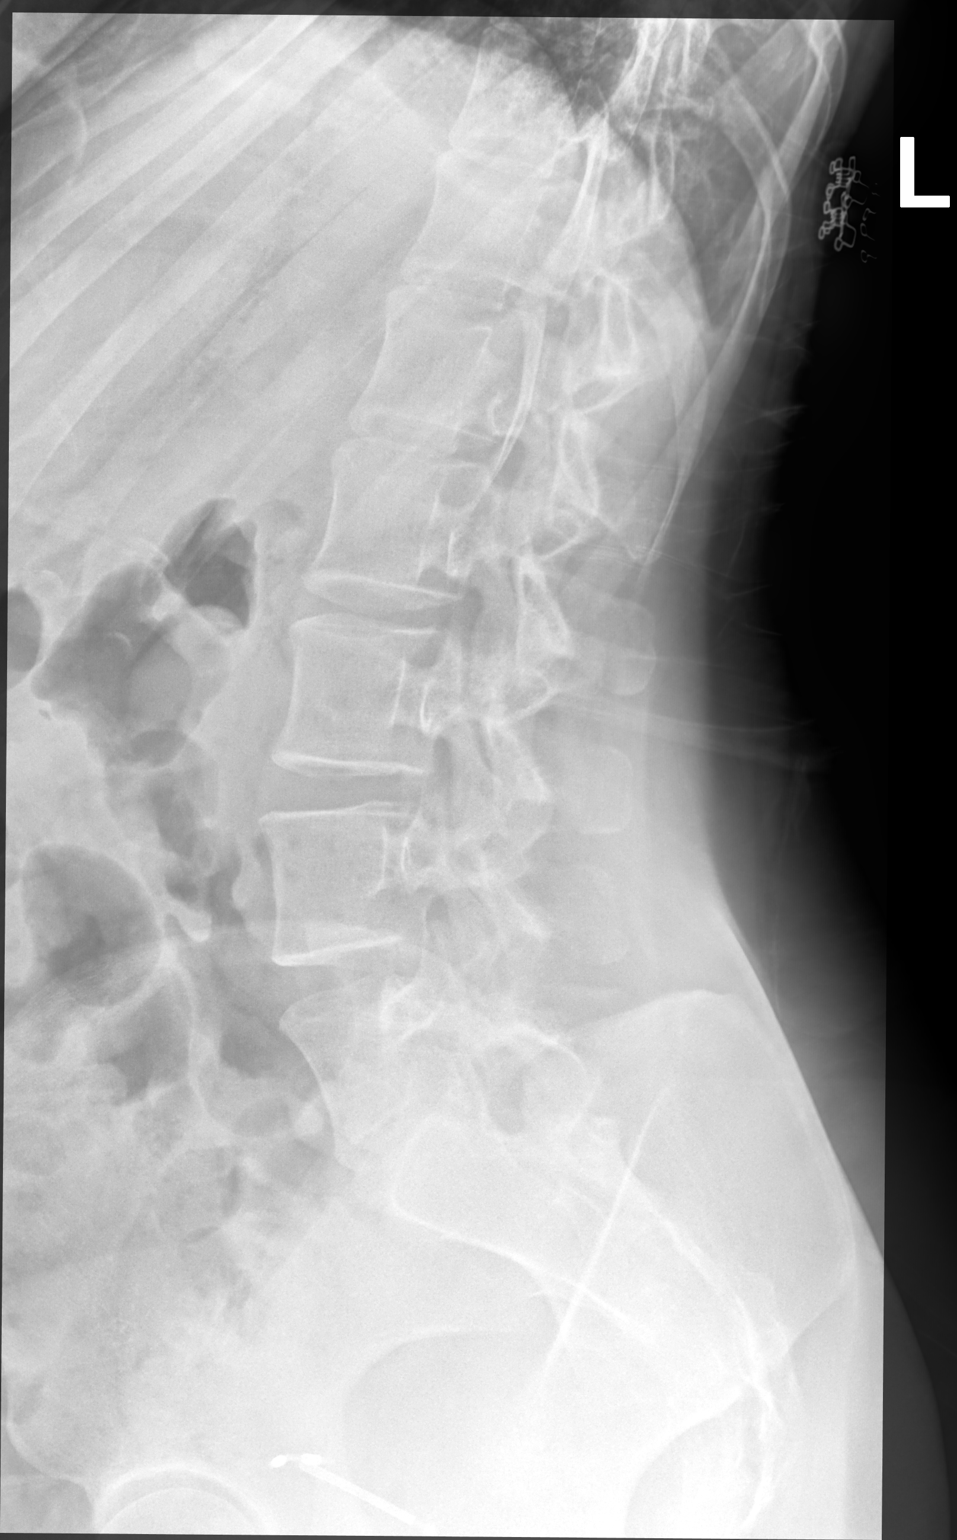

[2 of 2 positions shown; findings below may reference images not displayed]

FINDINGS: There are 5 non rib-bearing lumbar type vertebral bodies.

There is a very mild scoliotic curvature of the thoracolumbar spine
with dominant caudal component convex to the right measuring
approximately 4 degrees (as from the superior endplate T12 to the
inferior endplate L3), potentially positional. No anterolisthesis or
retrolisthesis.

Lumbar vertebral body heights appear preserved.

Lumbar intervertebral disc space heights appear preserved.

Limited visualization of the bilateral SI joints is normal.

An intrauterine device overlies midline of lower pelvis. Regional
bowel gas pattern and soft tissues.
IMPRESSION: Mild scoliotic curvature of the thoracolumbar spine, potentially
positional. Otherwise, no explanation patient's low back pain.

## 2020-04-11 ENCOUNTER — Other Ambulatory Visit: Payer: Self-pay

## 2020-04-11 ENCOUNTER — Ambulatory Visit (INDEPENDENT_AMBULATORY_CARE_PROVIDER_SITE_OTHER): Payer: 59 | Admitting: Adult Health

## 2020-04-11 ENCOUNTER — Encounter: Payer: Self-pay | Admitting: Adult Health

## 2020-04-11 DIAGNOSIS — F4312 Post-traumatic stress disorder, chronic: Secondary | ICD-10-CM | POA: Diagnosis not present

## 2020-04-11 DIAGNOSIS — F341 Dysthymic disorder: Secondary | ICD-10-CM | POA: Diagnosis not present

## 2020-04-11 DIAGNOSIS — F900 Attention-deficit hyperactivity disorder, predominantly inattentive type: Secondary | ICD-10-CM

## 2020-04-11 DIAGNOSIS — F428 Other obsessive-compulsive disorder: Secondary | ICD-10-CM

## 2020-04-11 MED ORDER — AMPHETAMINE-DEXTROAMPHETAMINE 20 MG PO TABS: 20.0000 mg | ORAL_TABLET | Freq: Every day | ORAL | 0 refills | Status: DC

## 2020-04-11 MED ORDER — FLUVOXAMINE MALEATE 100 MG PO TABS: 100.0000 mg | ORAL_TABLET | Freq: Every day | ORAL | 5 refills | Status: DC

## 2020-04-11 MED ORDER — TRAZODONE HCL 100 MG PO TABS: 100.0000 mg | ORAL_TABLET | Freq: Every evening | ORAL | 5 refills | Status: DC | PRN

## 2020-04-11 NOTE — Progress Notes (Signed)
Tracey Gibson [id number] December 11, 1990 29 y.o.  Subjective:   Patient ID:  Tracey Gibson is a 30 y.o. (DOB Apr 02, 1990) female.  Chief Complaint: No chief complaint on file.   HPI Tracey Gibson presents to the office today for follow-up of ADHD, OCD, PTSD, and MDD.  Describes mood today as "ok". Pleasant. Mood symptoms - denies depression, anxiety, and irritability. Stating "I'm doing pretty good with my medications". Feels like medications are working well. Stable interest and motivation. Taking medications as prescribed.  Energy levels stable. Active, does not have a regular exercise routine.  Enjoys some usual interests and activities. Single. Dating. Lives alone in an apartment with cat and a dog. Father local. Mother in [place]. Spending time with family. Appetite adequate. Weight stable. Sleeping difficulties lately. Averages 8 hours. Focus and concentration stable. Completing tasks. Managing aspects of household. Works full time for [employer]. Denies SI or HI.  Denies AH or VH.   GAD-7   Flowsheet Row Office Visit from 08/09/2019 in [facility] Office Visit from 01/30/2018 in [facility]  Total GAD-7 Score 0 1    PHQ2-9   Flowsheet Row Office Visit from 08/09/2019 in [facility] Office Visit from 01/30/2018 in [facility]  PHQ-2 Total Score 0 1  PHQ-9 Total Score 1 3       Review of Systems:  Review of Systems  Medications: I have reviewed the patient's current medications.  Current Outpatient Medications  Medication Sig Dispense Refill  . amphetamine-dextroamphetamine (ADDERALL) 20 MG tablet Take 1 tablet (20 mg total) by mouth daily after breakfast. 30 tablet 0  . [START ON 05/09/2020] amphetamine-dextroamphetamine (ADDERALL) 20 MG tablet Take 1 tablet (20 mg total) by mouth daily after breakfast. 30 tablet 0  . [START ON 06/06/2020] amphetamine-dextroamphetamine (ADDERALL) 20 MG  tablet Take 1 tablet (20 mg total) by mouth daily after breakfast. 30 tablet 0  . cetirizine (ZYRTEC) 10 MG tablet Take 1 tablet (10 mg total) by mouth daily. 30 tablet 11  . cyclobenzaprine (FLEXERIL) 10 MG tablet Take 10 mg by mouth 3 (three) times daily.  0  . fluvoxaMINE (LUVOX) 100 MG tablet Take 1 tablet (100 mg total) by mouth at bedtime. 30 tablet 5  . meloxicam (MOBIC) 15 MG tablet TAKE 1 TABLET EVERY DAY WITH FOOD  0  . PARAGARD INTRAUTERINE COPPER IU by Intrauterine route.    . sodium chloride (OCEAN) 0.65 % SOLN nasal spray Place 2 sprays into both nostrils every 2 (two) hours while awake for 30 days. (Patient not taking: Reported on 06/21/2019)  0  . traZODone (DESYREL) 100 MG tablet Take 1 tablet (100 mg total) by mouth at bedtime as needed for sleep. 30 tablet 5   No current facility-administered medications for this visit.    Medication Side Effects: None  Allergies: No Known Allergies  Past Medical History:  Diagnosis Date  . ADHD   . Anxiety   . Depression     Family History  Problem Relation Age of Onset  . Alcoholism Maternal Grandmother   . Suicidality Maternal Grandfather   . ADD / ADHD Maternal Aunt   . Bipolar disorder Maternal Aunt   . Alcohol abuse Maternal Aunt     Social History   Socioeconomic History  . Marital status: Single    Spouse name: Not on file  . Number of children: Not on file  . Years of education: Not on file  .  Highest education level: Not on file  Occupational History  . Not on file  Tobacco Use  . Smoking status: Never Smoker  . Smokeless tobacco: Never Used  Vaping Use  . Vaping Use: Former  Substance and Sexual Activity  . Alcohol use: Yes  . Drug use: Not Currently    Types: Marijuana  . Sexual activity: Yes    Birth control/protection: I.U.D.  Other Topics Concern  . Not on file  Social History Narrative  . Not on file   Social Determinants of Health   Financial Resource Strain: Not on file  Food Insecurity:  Not on file  Transportation Needs: Not on file  Physical Activity: Not on file  Stress: Not on file  Social Connections: Not on file  Intimate Partner Violence: Not on file    Past Medical History, Surgical history, Social history, and Family history were reviewed and updated as appropriate.   Please see review of systems for further details on the patient's review from today.   Objective:   Physical Exam:  There were no vitals taken for this visit.  Physical Exam  Lab Review:     Component Value Date/Time   NA 135 12/05/2019 1427   NA 137 08/19/2016 0934   K 3.5 12/05/2019 1427   CL 100 12/05/2019 1427   CO2 26 12/05/2019 1427   GLUCOSE 87 12/05/2019 1427   BUN 10 12/05/2019 1427   BUN 7 08/19/2016 0934   CREATININE 0.79 12/05/2019 1427   CALCIUM 9.2 12/05/2019 1427   PROT 7.0 08/19/2016 0934   ALBUMIN 4.8 08/19/2016 0934   AST 16 12/05/2019 1427   ALT 16 12/05/2019 1427   ALKPHOS 35 (L) 08/19/2016 0934   BILITOT 0.3 08/19/2016 0934   GFRNONAA 116 08/19/2016 0934   GFRAA 133 08/19/2016 0934       Component Value Date/Time   WBC 7.2 12/05/2019 1427   RBC 4.24 12/05/2019 1427   HGB 13.5 12/05/2019 1427   HGB 12.5 08/19/2016 0934   HCT 39.7 12/05/2019 1427   HCT 37.7 08/19/2016 0934   PLT 344.0 12/05/2019 1427   PLT 253 08/19/2016 0934   MCV 93.6 12/05/2019 1427   MCV 92 08/19/2016 0934   MCH 30.6 08/19/2016 0934   MCHC 34.1 12/05/2019 1427   RDW 13.2 12/05/2019 1427   RDW 13.1 08/19/2016 0934   LYMPHSABS 1.9 08/19/2016 0934   EOSABS 0.4 08/19/2016 0934   BASOSABS 0.0 08/19/2016 0934    No results found for: POCLITH, LITHIUM   No results found for: PHENYTOIN, PHENOBARB, VALPROATE, CBMZ   .res Assessment: Plan:    Plan:  PDMP reviewed  1. 2. 3.  Read and reviewed note with patient for accuracy.   RTC 4 weeks  Patient advised to contact office with any questions, adverse effects, or acute worsening in signs and symptoms.    Diagnoses  and all orders for this visit:  Other obsessive-compulsive disorders  Attention deficit hyperactivity disorder (ADHD), inattentive type, moderate -     amphetamine-dextroamphetamine (ADDERALL) 20 MG tablet; Take 1 tablet (20 mg total) by mouth daily after breakfast. -     amphetamine-dextroamphetamine (ADDERALL) 20 MG tablet; Take 1 tablet (20 mg total) by mouth daily after breakfast. -     amphetamine-dextroamphetamine (ADDERALL) 20 MG tablet; Take 1 tablet (20 mg total) by mouth daily after breakfast.  Chronic post-traumatic stress disorder -     fluvoxaMINE (LUVOX) 100 MG tablet; Take 1 tablet (100 mg total) by mouth at  bedtime. -     traZODone (DESYREL) 100 MG tablet; Take 1 tablet (100 mg total) by mouth at bedtime as needed for sleep.  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 -     traZODone (DESYREL) 100 MG tablet; Take 1 tablet (100 mg total) by mouth at bedtime as needed for sleep.     Please see After Visit Summary for patient specific instructions.  No future appointments.  No orders of the defined types were placed in this encounter.   -------------------------------

## 2020-05-13 ENCOUNTER — Other Ambulatory Visit: Payer: Self-pay

## 2020-05-13 DIAGNOSIS — F341 Dysthymic disorder: Secondary | ICD-10-CM

## 2020-05-13 DIAGNOSIS — F4312 Post-traumatic stress disorder, chronic: Secondary | ICD-10-CM

## 2020-05-13 MED ORDER — FLUVOXAMINE MALEATE 100 MG PO TABS: 100.0000 mg | ORAL_TABLET | Freq: Every day | ORAL | 1 refills | Status: DC

## 2020-05-13 MED ORDER — TRAZODONE HCL 100 MG PO TABS: 100.0000 mg | ORAL_TABLET | Freq: Every evening | ORAL | 1 refills | Status: DC | PRN

## 2020-07-17 ENCOUNTER — Encounter: Payer: Self-pay | Admitting: Family Medicine

## 2020-10-09 ENCOUNTER — Ambulatory Visit: Payer: 59 | Admitting: Adult Health

## 2020-10-14 ENCOUNTER — Telehealth: Payer: Self-pay | Admitting: Adult Health

## 2020-10-14 NOTE — Telephone Encounter (Signed)
Pt requesting Rx for Adderall 20 mg [facility] [address] [place]. Apt 8/30

## 2020-10-16 ENCOUNTER — Other Ambulatory Visit: Payer: Self-pay

## 2020-10-16 DIAGNOSIS — F900 Attention-deficit hyperactivity disorder, predominantly inattentive type: Secondary | ICD-10-CM

## 2020-10-16 MED ORDER — AMPHETAMINE-DEXTROAMPHETAMINE 20 MG PO TABS: 20.0000 mg | ORAL_TABLET | Freq: Every day | ORAL | 0 refills | Status: DC

## 2020-10-16 NOTE — Telephone Encounter (Signed)
Last refill 6/06 Pended for Dr. Jennelle Human to review and send to [facility]

## 2020-10-24 ENCOUNTER — Other Ambulatory Visit: Payer: Self-pay | Admitting: Adult Health

## 2020-10-24 DIAGNOSIS — F341 Dysthymic disorder: Secondary | ICD-10-CM

## 2020-10-24 DIAGNOSIS — F4312 Post-traumatic stress disorder, chronic: Secondary | ICD-10-CM

## 2020-10-28 ENCOUNTER — Other Ambulatory Visit: Payer: Self-pay

## 2020-10-28 ENCOUNTER — Ambulatory Visit (INDEPENDENT_AMBULATORY_CARE_PROVIDER_SITE_OTHER): Payer: 59 | Admitting: Adult Health

## 2020-10-28 ENCOUNTER — Encounter: Payer: Self-pay | Admitting: Adult Health

## 2020-10-28 DIAGNOSIS — F4312 Post-traumatic stress disorder, chronic: Secondary | ICD-10-CM

## 2020-10-28 DIAGNOSIS — F428 Other obsessive-compulsive disorder: Secondary | ICD-10-CM

## 2020-10-28 DIAGNOSIS — F341 Dysthymic disorder: Secondary | ICD-10-CM

## 2020-10-28 DIAGNOSIS — F900 Attention-deficit hyperactivity disorder, predominantly inattentive type: Secondary | ICD-10-CM | POA: Diagnosis not present

## 2020-10-28 MED ORDER — TRAZODONE HCL 100 MG PO TABS: ORAL_TABLET | ORAL | 1 refills | Status: DC

## 2020-10-28 MED ORDER — AMPHETAMINE-DEXTROAMPHETAMINE 20 MG PO TABS: 20.0000 mg | ORAL_TABLET | Freq: Every day | ORAL | 0 refills | Status: DC

## 2020-10-28 MED ORDER — FLUVOXAMINE MALEATE 100 MG PO TABS: 100.0000 mg | ORAL_TABLET | Freq: Every day | ORAL | 1 refills | Status: DC

## 2020-10-28 NOTE — Progress Notes (Signed)
Tracey Gibson [id number] Mar 31, 1990 30 y.o.  Subjective:   Patient ID:  Tracey Gibson is a 30 y.o. (DOB 12/15/1990) female.  Chief Complaint: No chief complaint on file.   HPI Tracey Gibson presents to the office today for follow-up of ADHD, OCD, PTSD, and MDD.  Describes mood today as "ok". Pleasant. Mood symptoms - denies depression, anxiety, and irritability. Had a "bout" of depression a few months ago after getting back with an ex-boyfriend and then ending the relationship. Stating "I'm doing alright". Feels like medications continue to work well. Planning to move closer to new job in [place]. Stable interest and motivation. Taking medications as prescribed.  Energy levels stable. Active, does not have a regular exercise routine.  Enjoys some usual interests and activities. Single. Dating. Lives alone in an apartment with cat and a dog. Father local. Mother in [place]. Spending time with family. Appetite adequate. Weight stable. Sleeping difficulties lately. Averages 8 hours. Focus and concentration stable. Completing tasks. Managing aspects of household. 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  Denies AH or VH.   GAD-7    Flowsheet Row Office Visit from 08/09/2019 in [facility] Office Visit from 01/30/2018 in [facility]  Total GAD-7 Score 0 1      PHQ2-9    Flowsheet Row Office Visit from 08/09/2019 in [facility] Office Visit from 01/30/2018 in [facility]  PHQ-2 Total Score 0 1  PHQ-9 Total Score 1 3        Review of Systems:  Review of Systems  Musculoskeletal:  Negative for gait problem.  Neurological:  Negative for tremors.  Psychiatric/Behavioral:         Please refer to HPI   Medications: I have reviewed the patient's current medications.  Current Outpatient Medications  Medication Sig Dispense Refill   amphetamine-dextroamphetamine (ADDERALL) 20 MG tablet  Take 1 tablet (20 mg total) by mouth daily after breakfast. 30 tablet 0   [START ON 11/25/2020] amphetamine-dextroamphetamine (ADDERALL) 20 MG tablet Take 1 tablet (20 mg total) by mouth daily after breakfast. 30 tablet 0   [START ON 12/23/2020] amphetamine-dextroamphetamine (ADDERALL) 20 MG tablet Take 1 tablet (20 mg total) by mouth daily after breakfast. 30 tablet 0   fluvoxaMINE (LUVOX) 100 MG tablet Take 1 tablet (100 mg total) by mouth at bedtime. 90 tablet 1   PARAGARD INTRAUTERINE COPPER IU by Intrauterine route.     traZODone (DESYREL) 100 MG tablet Take 1 tablet (100 mg) by mouth nightly at bedtime as needed for sleep. 90 tablet 1   No current facility-administered medications for this visit.    Medication Side Effects: None  Allergies: No Known Allergies  Past Medical History:  Diagnosis Date   ADHD    Anxiety    Depression     Past Medical History, Surgical history, Social history, and Family history were reviewed and updated as appropriate.   Please see review of systems for further details on the patient's review from today.   Objective:   Physical Exam:  There were no vitals taken for this visit.  Physical Exam Constitutional:      General: She is not in acute distress. Musculoskeletal:        General: No deformity.  Neurological:     Mental Status: She is alert and oriented to person, place, and time.     Coordination: Coordination normal.  Psychiatric:        Attention  and Perception: Attention and perception normal. She does not perceive auditory or visual hallucinations.        Mood and Affect: Mood normal. Mood is not anxious or depressed. Affect is not labile, blunt, angry or inappropriate.        Speech: Speech normal.        Behavior: Behavior normal.        Thought Content: Thought content normal. Thought content is not paranoid or delusional. Thought content does not include homicidal or suicidal ideation. Thought content does not include homicidal  or suicidal plan.        Cognition and Memory: Cognition and memory normal.        Judgment: Judgment normal.     Comments: Insight intact    Lab Review:     Component Value Date/Time   NA 135 12/05/2019 1427   NA 137 08/19/2016 0934   K 3.5 12/05/2019 1427   CL 100 12/05/2019 1427   CO2 26 12/05/2019 1427   GLUCOSE 87 12/05/2019 1427   BUN 10 12/05/2019 1427   BUN 7 08/19/2016 0934   CREATININE 0.79 12/05/2019 1427   CALCIUM 9.2 12/05/2019 1427   PROT 7.0 08/19/2016 0934   ALBUMIN 4.8 08/19/2016 0934   AST 16 12/05/2019 1427   ALT 16 12/05/2019 1427   ALKPHOS 35 (L) 08/19/2016 0934   BILITOT 0.3 08/19/2016 0934   GFRNONAA 116 08/19/2016 0934   GFRAA 133 08/19/2016 0934       Component Value Date/Time   WBC 7.2 12/05/2019 1427   RBC 4.24 12/05/2019 1427   HGB 13.5 12/05/2019 1427   HGB 12.5 08/19/2016 0934   HCT 39.7 12/05/2019 1427   HCT 37.7 08/19/2016 0934   PLT 344.0 12/05/2019 1427   PLT 253 08/19/2016 0934   MCV 93.6 12/05/2019 1427   MCV 92 08/19/2016 0934   MCH 30.6 08/19/2016 0934   MCHC 34.1 12/05/2019 1427   RDW 13.2 12/05/2019 1427   RDW 13.1 08/19/2016 0934   LYMPHSABS 1.9 08/19/2016 0934   EOSABS 0.4 08/19/2016 0934   BASOSABS 0.0 08/19/2016 0934    No results found for: POCLITH, LITHIUM   No results found for: PHENYTOIN, PHENOBARB, VALPROATE, CBMZ   .res Assessment: Plan:    Plan:  PDMP reviewed  Adderall 20mg  daily Luvox 100mg  daily Trazadone 100mg  at hs  106/72 - 84   Discussed potential benefits, risks, and side effects of stimulants with patient to include increased heart rate, palpitations, insomnia, increased anxiety, increased irritability, or decreased appetite.  Instructed patient to contact office if experiencing any significant tolerability issues.   RTC 6 months  Patient advised to contact office with any questions, adverse effects, or acute worsening in signs and symptoms.   Diagnoses and all orders for this  visit:  Other obsessive-compulsive disorders  Attention deficit hyperactivity disorder (ADHD), inattentive type, moderate -     amphetamine-dextroamphetamine (ADDERALL) 20 MG tablet; Take 1 tablet (20 mg total) by mouth daily after breakfast. -     amphetamine-dextroamphetamine (ADDERALL) 20 MG tablet; Take 1 tablet (20 mg total) by mouth daily after breakfast. -     amphetamine-dextroamphetamine (ADDERALL) 20 MG tablet; Take 1 tablet (20 mg total) by mouth daily after breakfast.  Chronic post-traumatic stress disorder -     fluvoxaMINE (LUVOX) 100 MG tablet; Take 1 tablet (100 mg total) by mouth at bedtime. -     traZODone (DESYREL) 100 MG tablet; Take 1 tablet (100 mg) by mouth nightly at bedtime  as needed for sleep.  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 -     traZODone (DESYREL) 100 MG tablet; Take 1 tablet (100 mg) by mouth nightly at bedtime as needed for sleep.    Please see After Visit Summary for patient specific instructions.  No future appointments.  No orders of the defined types were placed in this encounter.   -------------------------------

## 2020-11-13 ENCOUNTER — Ambulatory Visit: Payer: Managed Care, Other (non HMO) | Admitting: Family Medicine

## 2021-01-07 ENCOUNTER — Other Ambulatory Visit: Payer: Self-pay | Admitting: Adult Health

## 2021-01-07 DIAGNOSIS — F900 Attention-deficit hyperactivity disorder, predominantly inattentive type: Secondary | ICD-10-CM

## 2021-01-07 MED ORDER — AMPHETAMINE-DEXTROAMPHETAMINE 20 MG PO TABS: 20.0000 mg | ORAL_TABLET | Freq: Every day | ORAL | 0 refills | Status: DC

## 2021-01-07 NOTE — Telephone Encounter (Signed)
Patient called in for refill on Adderall 20mg . States she has recently moved and has a new pharmacy she would like Adderall sent to. Ph: [phone] Appt 2/28. Pharmacy [facility] [address] [place], [place]

## 2021-02-02 ENCOUNTER — Other Ambulatory Visit: Payer: Self-pay | Admitting: Adult Health

## 2021-02-02 DIAGNOSIS — F341 Dysthymic disorder: Secondary | ICD-10-CM

## 2021-02-02 DIAGNOSIS — F4312 Post-traumatic stress disorder, chronic: Secondary | ICD-10-CM

## 2021-04-28 ENCOUNTER — Ambulatory Visit: Payer: 59 | Admitting: Adult Health

## 2021-04-28 NOTE — Progress Notes (Signed)
Patient no show appointment. ? ?

## 2021-05-27 ENCOUNTER — Ambulatory Visit: Payer: Self-pay | Admitting: Adult Health

## 2021-06-08 ENCOUNTER — Encounter: Payer: Self-pay | Admitting: Adult Health

## 2021-06-08 ENCOUNTER — Telehealth (INDEPENDENT_AMBULATORY_CARE_PROVIDER_SITE_OTHER): Payer: BC Managed Care – PPO | Admitting: Adult Health

## 2021-06-08 DIAGNOSIS — F341 Dysthymic disorder: Secondary | ICD-10-CM | POA: Diagnosis not present

## 2021-06-08 DIAGNOSIS — F900 Attention-deficit hyperactivity disorder, predominantly inattentive type: Secondary | ICD-10-CM | POA: Diagnosis not present

## 2021-06-08 DIAGNOSIS — F428 Other obsessive-compulsive disorder: Secondary | ICD-10-CM

## 2021-06-08 DIAGNOSIS — F4312 Post-traumatic stress disorder, chronic: Secondary | ICD-10-CM

## 2021-06-08 MED ORDER — AMPHETAMINE-DEXTROAMPHETAMINE 20 MG PO TABS: 20.0000 mg | ORAL_TABLET | Freq: Every day | ORAL | 0 refills | Status: DC

## 2021-06-08 MED ORDER — TRAZODONE HCL 100 MG PO TABS: ORAL_TABLET | ORAL | 3 refills | Status: DC

## 2021-06-08 MED ORDER — FLUVOXAMINE MALEATE 100 MG PO TABS: 100.0000 mg | ORAL_TABLET | Freq: Every day | ORAL | 3 refills | Status: DC

## 2021-06-08 NOTE — Progress Notes (Signed)
Tracey Gibson ?[id number] ?10/23/1990 ?31 y.o. ? ?Virtual Visit via Video Note ? ?I connected with pt @ on 06/08/21 at  4:20 PM EDT by a video enabled telemedicine application and verified that I am speaking with the correct person using two identifiers. ?  ?I discussed the limitations of evaluation and management by telemedicine and the availability of in person appointments. The patient expressed understanding and agreed to proceed. ? ?I discussed the assessment and treatment plan with the patient. The patient was provided an opportunity to ask questions and all were answered. The patient agreed with the plan and demonstrated an understanding of the instructions. ?  ?The patient was advised to call back or seek an in-person evaluation if the symptoms worsen or if the condition fails to improve as anticipated. ? ?I provided 25 minutes of non-face-to-face time during this encounter.  The patient was located at home.  The provider was [facility] at [facility]. ? ? ?Tracey Gibbsegina N Brielynn Sekula, NP  ? ?Subjective:  ? ?Patient ID:  Tracey Gibson is a 31 y.o. (DOB 07/09/1990) female. ? ?Chief Complaint: No chief complaint on file. ? ? ?HPI ?Tracey Gibson presents for follow-up of ADHD, OCD, PTSD, and MDD. ? ?Describes mood today as "ok". Pleasant. Mood symptoms - denies depression, anxiety, and irritability. Stating "I'm doing really good". Feels like medications continue to work well. Moved to [place] - made some friends. Stable interest and motivation. Taking medications as prescribed.  ?Energy levels lower with Covid.Active, does not have a regular exercise routine.  ?Enjoys some usual interests and activities. Dating. Lives alone in an apartment with cat and a dog. Father local. Mother in [place]. Spending time with family. ?Appetite adequate. Weight stable. ?Difficulties getting to sleep over past 2 weeks. Averages 7 to 8 hours. ?Focus and concentration stable. Completing tasks. Managing aspects of household. Works  full time - Management consultanthome builder. ?Denies SI or HI.  ?Denies AH or VH. ? ?Review of Systems:  ?Review of Systems  ?Musculoskeletal:  Negative for gait problem.  ?Neurological:  Negative for tremors.  ?Psychiatric/Behavioral:    ?     Please refer to HPI  ? ?Medications: I have reviewed the patient's current medications. ? ?Current Outpatient Medications  ?Medication Sig Dispense Refill  ? amphetamine-dextroamphetamine (ADDERALL) 20 MG tablet Take 1 tablet (20 mg total) by mouth daily after breakfast. 30 tablet 0  ? [START ON 07/06/2021] amphetamine-dextroamphetamine (ADDERALL) 20 MG tablet Take 1 tablet (20 mg total) by mouth daily after breakfast. 30 tablet 0  ? [START ON 08/03/2021] amphetamine-dextroamphetamine (ADDERALL) 20 MG tablet Take 1 tablet (20 mg total) by mouth daily after breakfast. 30 tablet 0  ? fluvoxaMINE (LUVOX) 100 MG tablet Take 1 tablet (100 mg total) by mouth at bedtime. 90 tablet 3  ? PARAGARD INTRAUTERINE COPPER IU by Intrauterine route.    ? traZODone (DESYREL) 100 MG tablet Take 1 tablet by mouth nightly at bedtime as needed for sleep. 90 tablet 3  ? ?No current facility-administered medications for this visit.  ? ? ?Medication Side Effects: None ? ?Allergies: No Known Allergies ? ?Past Medical History:  ?Diagnosis Date  ? ADHD   ? Anxiety   ? Depression   ? ? ?Family History  ?Problem Relation Age of Onset  ? Alcoholism Maternal Grandmother   ? Suicidality Maternal Grandfather   ? ADD / ADHD Maternal Aunt   ? Bipolar disorder Maternal Aunt   ? Alcohol abuse Maternal Aunt   ? ? ?Social History  ? ?  Socioeconomic History  ? Marital status: Single  ?  Spouse name: Not on file  ? Number of children: Not on file  ? Years of education: Not on file  ? Highest education level: Not on file  ?Occupational History  ? Not on file  ?Tobacco Use  ? Smoking status: Never  ? Smokeless tobacco: Never  ?Vaping Use  ? Vaping Use: Former  ?Substance and Sexual Activity  ? Alcohol use: Yes  ? Drug use: Not Currently  ?   Types: Marijuana  ? Sexual activity: Yes  ?  Birth control/protection: I.U.D.  ?Other Topics Concern  ? Not on file  ?Social History Narrative  ? Not on file  ? ?Social Determinants of Health  ? ?Financial Resource Strain: Not on file  ?Food Insecurity: Not on file  ?Transportation Needs: Not on file  ?Physical Activity: Not on file  ?Stress: Not on file  ?Social Connections: Not on file  ?Intimate Partner Violence: Not on file  ? ? ?Past Medical History, Surgical history, Social history, and Family history were reviewed and updated as appropriate.  ? ?Please see review of systems for further details on the patient's review from today.  ? ?Objective:  ? ?Physical Exam:  ?There were no vitals taken for this visit. ? ?Physical Exam ?Constitutional:   ?   General: She is not in acute distress. ?Musculoskeletal:     ?   General: No deformity.  ?Neurological:  ?   Mental Status: She is alert and oriented to person, place, and time.  ?   Coordination: Coordination normal.  ?Psychiatric:     ?   Attention and Perception: Attention and perception normal. She does not perceive auditory or visual hallucinations.     ?   Mood and Affect: Mood normal. Mood is not anxious or depressed. Affect is not labile, blunt, angry or inappropriate.     ?   Speech: Speech normal.     ?   Behavior: Behavior normal.     ?   Thought Content: Thought content normal. Thought content is not paranoid or delusional. Thought content does not include homicidal or suicidal ideation. Thought content does not include homicidal or suicidal plan.     ?   Cognition and Memory: Cognition and memory normal.     ?   Judgment: Judgment normal.  ?   Comments: Insight intact  ? ? ?Lab Review:  ?   ?Component Value Date/Time  ? NA 135 12/05/2019 1427  ? NA 137 08/19/2016 0934  ? K 3.5 12/05/2019 1427  ? CL 100 12/05/2019 1427  ? CO2 26 12/05/2019 1427  ? GLUCOSE 87 12/05/2019 1427  ? BUN 10 12/05/2019 1427  ? BUN 7 08/19/2016 0934  ? CREATININE 0.79 12/05/2019  1427  ? CALCIUM 9.2 12/05/2019 1427  ? PROT 7.0 08/19/2016 0934  ? ALBUMIN 4.8 08/19/2016 0934  ? AST 16 12/05/2019 1427  ? ALT 16 12/05/2019 1427  ? ALKPHOS 35 (L) 08/19/2016 0934  ? BILITOT 0.3 08/19/2016 0934  ? GFRNONAA 116 08/19/2016 0934  ? GFRAA 133 08/19/2016 0934  ? ? ?   ?Component Value Date/Time  ? WBC 7.2 12/05/2019 1427  ? RBC 4.24 12/05/2019 1427  ? HGB 13.5 12/05/2019 1427  ? HGB 12.5 08/19/2016 0934  ? HCT 39.7 12/05/2019 1427  ? HCT 37.7 08/19/2016 0934  ? PLT 344.0 12/05/2019 1427  ? PLT 253 08/19/2016 0934  ? MCV 93.6 12/05/2019 1427  ? MCV 92 08/19/2016  0934  ? MCH 30.6 08/19/2016 0934  ? MCHC 34.1 12/05/2019 1427  ? RDW 13.2 12/05/2019 1427  ? RDW 13.1 08/19/2016 0934  ? LYMPHSABS 1.9 08/19/2016 0934  ? EOSABS 0.4 08/19/2016 0934  ? BASOSABS 0.0 08/19/2016 0934  ? ? ?No results found for: POCLITH, LITHIUM  ? ?No results found for: PHENYTOIN, PHENOBARB, VALPROATE, CBMZ  ? ?.res ?Assessment: Plan:   ? ?Plan: ? ?PDMP reviewed ? ?Adderall 20mg  daily ?Luvox 100mg  daily ?Trazadone 100mg  at hs ? ?Check BP between visits ? ?Discussed potential benefits, risks, and side effects of stimulants with patient to include increased heart rate, palpitations, insomnia, increased anxiety, increased irritability, or decreased appetite.  Instructed patient to contact office if experiencing any significant tolerability issues.  ? ?RTC 6 months ? ?Patient advised to contact office with any questions, adverse effects, or acute worsening in signs and symptoms. ? ?Diagnoses and all orders for this visit: ? 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 ?-     fluvoxaMINE (LUVOX) 100 MG tablet; Take 1 tablet (100 mg total) by mouth at bedtime. ? ?Attention deficit hyperactivity disorder (ADHD), inattentive type, moderate ?-      amphetamine-dextroamphetamine (ADDERALL) 20 MG tablet; Take 1 tablet (20 mg total) by mouth daily after breakfast. ?-     amphetamine-dextroamphetamine (ADDERALL) 20 MG tablet; Take 1 tablet (20 mg total)

## 2021-09-21 DIAGNOSIS — Z0000 Encounter for general adult medical examination without abnormal findings: Secondary | ICD-10-CM | POA: Diagnosis not present

## 2021-09-21 DIAGNOSIS — E782 Mixed hyperlipidemia: Secondary | ICD-10-CM | POA: Diagnosis not present

## 2021-11-09 DIAGNOSIS — F4323 Adjustment disorder with mixed anxiety and depressed mood: Secondary | ICD-10-CM | POA: Diagnosis not present

## 2021-11-16 DIAGNOSIS — F4323 Adjustment disorder with mixed anxiety and depressed mood: Secondary | ICD-10-CM | POA: Diagnosis not present

## 2021-11-23 DIAGNOSIS — F4323 Adjustment disorder with mixed anxiety and depressed mood: Secondary | ICD-10-CM | POA: Diagnosis not present

## 2021-11-30 DIAGNOSIS — F4323 Adjustment disorder with mixed anxiety and depressed mood: Secondary | ICD-10-CM | POA: Diagnosis not present

## 2021-12-02 ENCOUNTER — Other Ambulatory Visit: Payer: Self-pay

## 2021-12-02 ENCOUNTER — Telehealth: Payer: Self-pay | Admitting: Adult Health

## 2021-12-02 DIAGNOSIS — F900 Attention-deficit hyperactivity disorder, predominantly inattentive type: Secondary | ICD-10-CM

## 2021-12-02 MED ORDER — AMPHETAMINE-DEXTROAMPHETAMINE 20 MG PO TABS: 20.0000 mg | ORAL_TABLET | Freq: Every day | ORAL | 0 refills | Status: DC

## 2021-12-02 NOTE — Telephone Encounter (Signed)
Pended.

## 2021-12-02 NOTE — Telephone Encounter (Signed)
Tracey Gibson called today at 10:55 to request refill of her Adderall.  Made appt for 10/9.  Send to [facility] on [address] in [place]

## 2021-12-07 ENCOUNTER — Encounter: Payer: Self-pay | Admitting: Adult Health

## 2021-12-07 ENCOUNTER — Ambulatory Visit (INDEPENDENT_AMBULATORY_CARE_PROVIDER_SITE_OTHER): Payer: Self-pay | Admitting: Adult Health

## 2021-12-07 DIAGNOSIS — F489 Nonpsychotic mental disorder, unspecified: Secondary | ICD-10-CM

## 2021-12-07 DIAGNOSIS — F4323 Adjustment disorder with mixed anxiety and depressed mood: Secondary | ICD-10-CM | POA: Diagnosis not present

## 2021-12-07 NOTE — Progress Notes (Signed)
Patient no show appointment. ? ?

## 2021-12-16 DIAGNOSIS — F4323 Adjustment disorder with mixed anxiety and depressed mood: Secondary | ICD-10-CM | POA: Diagnosis not present

## 2021-12-23 DIAGNOSIS — F4323 Adjustment disorder with mixed anxiety and depressed mood: Secondary | ICD-10-CM | POA: Diagnosis not present

## 2021-12-30 DIAGNOSIS — F4323 Adjustment disorder with mixed anxiety and depressed mood: Secondary | ICD-10-CM | POA: Diagnosis not present

## 2022-01-06 DIAGNOSIS — F4323 Adjustment disorder with mixed anxiety and depressed mood: Secondary | ICD-10-CM | POA: Diagnosis not present

## 2022-01-13 DIAGNOSIS — F4323 Adjustment disorder with mixed anxiety and depressed mood: Secondary | ICD-10-CM | POA: Diagnosis not present

## 2022-01-27 DIAGNOSIS — F4323 Adjustment disorder with mixed anxiety and depressed mood: Secondary | ICD-10-CM | POA: Diagnosis not present

## 2022-02-03 DIAGNOSIS — F4323 Adjustment disorder with mixed anxiety and depressed mood: Secondary | ICD-10-CM | POA: Diagnosis not present

## 2022-02-23 DIAGNOSIS — Z708 Other sex counseling: Secondary | ICD-10-CM | POA: Diagnosis not present

## 2022-02-23 DIAGNOSIS — Z23 Encounter for immunization: Secondary | ICD-10-CM | POA: Diagnosis not present

## 2022-02-23 DIAGNOSIS — Z7251 High risk heterosexual behavior: Secondary | ICD-10-CM | POA: Diagnosis not present

## 2022-02-23 DIAGNOSIS — Z789 Other specified health status: Secondary | ICD-10-CM | POA: Diagnosis not present

## 2022-02-23 DIAGNOSIS — Z202 Contact with and (suspected) exposure to infections with a predominantly sexual mode of transmission: Secondary | ICD-10-CM | POA: Diagnosis not present

## 2022-02-24 DIAGNOSIS — F4323 Adjustment disorder with mixed anxiety and depressed mood: Secondary | ICD-10-CM | POA: Diagnosis not present

## 2022-03-03 DIAGNOSIS — F4323 Adjustment disorder with mixed anxiety and depressed mood: Secondary | ICD-10-CM | POA: Diagnosis not present

## 2022-03-04 ENCOUNTER — Ambulatory Visit: Payer: BC Managed Care – PPO | Admitting: Adult Health

## 2022-03-04 ENCOUNTER — Encounter: Payer: Self-pay | Admitting: Adult Health

## 2022-03-04 DIAGNOSIS — F4312 Post-traumatic stress disorder, chronic: Secondary | ICD-10-CM

## 2022-03-04 DIAGNOSIS — F331 Major depressive disorder, recurrent, moderate: Secondary | ICD-10-CM | POA: Diagnosis not present

## 2022-03-04 DIAGNOSIS — F428 Other obsessive-compulsive disorder: Secondary | ICD-10-CM

## 2022-03-04 DIAGNOSIS — F900 Attention-deficit hyperactivity disorder, predominantly inattentive type: Secondary | ICD-10-CM

## 2022-03-04 MED ORDER — AMPHETAMINE-DEXTROAMPHETAMINE 20 MG PO TABS: 20.0000 mg | ORAL_TABLET | Freq: Every day | ORAL | 0 refills | Status: DC

## 2022-03-04 NOTE — Progress Notes (Signed)
Tracey Gibson [id number] April 26, 1990 32 y.o.  Subjective:   Patient ID:  Tracey Gibson is a 32 y.o. (DOB 1991/02/15) female.  Chief Complaint: No chief complaint on file.   HPI Tracey Gibson presents to the office today for follow-up of ADHD, OCD, PTSD, and MDD.  Describes mood today as "ok". Pleasant. Mood symptoms - reports depression and anxiety. Decreased , irritability. Decreased worry and rumination. Mood is lower. Stating "I'm not doing my best". Ran out of Adderall a month ago and has felt a decline in mood - having issues completing tasks at home and at work. Spending time with friends - recent trip to [place]. Stable interest and motivation. Taking medications as prescribed.  Energy levels lower. Active, does not have a regular exercise routine.  Enjoys some usual interests and activities. Single. Lives alone in an apartment with cat and a dog. Father local. Mother in [place]. Spending time with family. Appetite adequate. Weight gain. Sleeping well at night. Averages 7 to 8 hours. Focus and concentration difficulties - has been out of Adderall. Completing tasks. Managing aspects of household. Works full time - Research officer, trade union. Denies SI or HI.  Denies AH or VH.    [place] Office Visit from 08/09/2019 in [place] Visit from 01/30/2018 in [place]  Total GAD-7 Score 0 1      PHQ2-9    [place] Visit from 08/09/2019 in [place] Visit from 01/30/2018 in [place]  PHQ-2 Total Score 0 1  PHQ-9 Total Score 1 3        Review of Systems:  Review of Systems  Musculoskeletal:  Negative for gait problem.  Neurological:  Negative for tremors.  Psychiatric/Behavioral:         Please refer to HPI    Medications: I have reviewed the patient's current medications.  Current Outpatient Medications  Medication Sig Dispense Refill    amphetamine-dextroamphetamine (ADDERALL) 20 MG tablet Take 1 tablet (20 mg total) by mouth daily after breakfast. 30 tablet 0   [START ON 04/01/2022] amphetamine-dextroamphetamine (ADDERALL) 20 MG tablet Take 1 tablet (20 mg total) by mouth daily after breakfast. 30 tablet 0   [START ON 04/29/2022] amphetamine-dextroamphetamine (ADDERALL) 20 MG tablet Take 1 tablet (20 mg total) by mouth daily after breakfast. 30 tablet 0   fluvoxaMINE (LUVOX) 100 MG tablet Take 1 tablet (100 mg total) by mouth at bedtime. 90 tablet 3   PARAGARD INTRAUTERINE COPPER IU by Intrauterine route.     traZODone (DESYREL) 100 MG tablet Take 1 tablet by mouth nightly at bedtime as needed for sleep. 90 tablet 3   No current facility-administered medications for this visit.    Medication Side Effects: None  Allergies: No Known Allergies  Past Medical History:  Diagnosis Date   ADHD    Anxiety    Depression     Past Medical History, Surgical history, Social history, and Family history were reviewed and updated as appropriate.   Please see review of systems for further details on the patient's review from today.   Objective:   Physical Exam:  There were no vitals taken for this visit.  Physical Exam Constitutional:      General: She is not in acute distress. Musculoskeletal:        General: No deformity.  Neurological:     Mental Status: She is alert and oriented to person, place, and time.  Coordination: Coordination normal.  Psychiatric:        Attention and Perception: Attention and perception normal. She does not perceive auditory or visual hallucinations.        Mood and Affect: Mood normal. Mood is not anxious or depressed. Affect is not labile, blunt, angry or inappropriate.        Speech: Speech normal.        Behavior: Behavior normal.        Thought Content: Thought content normal. Thought content is not paranoid or delusional. Thought content does not include homicidal or suicidal ideation.  Thought content does not include homicidal or suicidal plan.        Cognition and Memory: Cognition and memory normal.        Judgment: Judgment normal.     Comments: Insight intact     Lab Review:     Component Value Date/Time   NA 135 12/05/2019 1427   NA 137 08/19/2016 0934   K 3.5 12/05/2019 1427   CL 100 12/05/2019 1427   CO2 26 12/05/2019 1427   GLUCOSE 87 12/05/2019 1427   BUN 10 12/05/2019 1427   BUN 7 08/19/2016 0934   CREATININE 0.79 12/05/2019 1427   CALCIUM 9.2 12/05/2019 1427   PROT 7.0 08/19/2016 0934   ALBUMIN 4.8 08/19/2016 0934   AST 16 12/05/2019 1427   ALT 16 12/05/2019 1427   ALKPHOS 35 (L) 08/19/2016 0934   BILITOT 0.3 08/19/2016 0934   GFRNONAA 116 08/19/2016 0934   GFRAA 133 08/19/2016 0934       Component Value Date/Time   WBC 7.2 12/05/2019 1427   RBC 4.24 12/05/2019 1427   HGB 13.5 12/05/2019 1427   HGB 12.5 08/19/2016 0934   HCT 39.7 12/05/2019 1427   HCT 37.7 08/19/2016 0934   PLT 344.0 12/05/2019 1427   PLT 253 08/19/2016 0934   MCV 93.6 12/05/2019 1427   MCV 92 08/19/2016 0934   MCH 30.6 08/19/2016 0934   MCHC 34.1 12/05/2019 1427   RDW 13.2 12/05/2019 1427   RDW 13.1 08/19/2016 0934   LYMPHSABS 1.9 08/19/2016 0934   EOSABS 0.4 08/19/2016 0934   BASOSABS 0.0 08/19/2016 0934    No results found for: "POCLITH", "LITHIUM"   No results found for: "PHENYTOIN", "PHENOBARB", "VALPROATE", "CBMZ"   .res Assessment: Plan:    Plan:  PDMP reviewed  Restart Adderall 20mg  daily Luvox 100mg  daily Trazadone 100mg  at hs  Check BP between visits  Discussed potential benefits, risks, and side effects of stimulants with patient to include increased heart rate, palpitations, insomnia, increased anxiety, increased irritability, or decreased appetite.  Instructed patient to contact office if experiencing any significant tolerability issues.   RTC 3 months  Patient advised to contact office with any questions, adverse effects, or acute  worsening in signs and symptoms.  Diagnoses and all orders for this visit:  Major depressive disorder, recurrent episode, moderate (HCC)  Other obsessive-compulsive disorders  Chronic post-traumatic stress disorder  Attention deficit hyperactivity disorder (ADHD), inattentive type, moderate -     amphetamine-dextroamphetamine (ADDERALL) 20 MG tablet; Take 1 tablet (20 mg total) by mouth daily after breakfast. -     amphetamine-dextroamphetamine (ADDERALL) 20 MG tablet; Take 1 tablet (20 mg total) by mouth daily after breakfast. -     amphetamine-dextroamphetamine (ADDERALL) 20 MG tablet; Take 1 tablet (20 mg total) by mouth daily after breakfast.     Please see After Visit Summary for patient specific instructions.  No future appointments.  No orders of the defined types were placed in this encounter.   -------------------------------

## 2022-03-09 DIAGNOSIS — Z01419 Encounter for gynecological examination (general) (routine) without abnormal findings: Secondary | ICD-10-CM | POA: Diagnosis not present

## 2022-03-09 DIAGNOSIS — Z124 Encounter for screening for malignant neoplasm of cervix: Secondary | ICD-10-CM | POA: Diagnosis not present

## 2022-03-09 DIAGNOSIS — Z113 Encounter for screening for infections with a predominantly sexual mode of transmission: Secondary | ICD-10-CM | POA: Diagnosis not present

## 2022-03-09 DIAGNOSIS — Z6830 Body mass index (BMI) 30.0-30.9, adult: Secondary | ICD-10-CM | POA: Diagnosis not present

## 2022-03-09 DIAGNOSIS — Z1151 Encounter for screening for human papillomavirus (HPV): Secondary | ICD-10-CM | POA: Diagnosis not present

## 2022-03-09 DIAGNOSIS — N760 Acute vaginitis: Secondary | ICD-10-CM | POA: Diagnosis not present

## 2022-03-17 DIAGNOSIS — F4323 Adjustment disorder with mixed anxiety and depressed mood: Secondary | ICD-10-CM | POA: Diagnosis not present

## 2022-03-24 DIAGNOSIS — F4323 Adjustment disorder with mixed anxiety and depressed mood: Secondary | ICD-10-CM | POA: Diagnosis not present

## 2022-03-31 DIAGNOSIS — F4323 Adjustment disorder with mixed anxiety and depressed mood: Secondary | ICD-10-CM | POA: Diagnosis not present

## 2022-04-05 ENCOUNTER — Ambulatory Visit (INDEPENDENT_AMBULATORY_CARE_PROVIDER_SITE_OTHER): Payer: Self-pay | Admitting: Adult Health

## 2022-04-05 DIAGNOSIS — F489 Nonpsychotic mental disorder, unspecified: Secondary | ICD-10-CM

## 2022-04-05 NOTE — Progress Notes (Signed)
Patient no show appointment. ? ?

## 2022-04-07 DIAGNOSIS — F4323 Adjustment disorder with mixed anxiety and depressed mood: Secondary | ICD-10-CM | POA: Diagnosis not present

## 2022-04-14 DIAGNOSIS — F4323 Adjustment disorder with mixed anxiety and depressed mood: Secondary | ICD-10-CM | POA: Diagnosis not present

## 2022-04-21 DIAGNOSIS — F4323 Adjustment disorder with mixed anxiety and depressed mood: Secondary | ICD-10-CM | POA: Diagnosis not present

## 2022-04-27 ENCOUNTER — Other Ambulatory Visit: Payer: Self-pay

## 2022-04-27 ENCOUNTER — Ambulatory Visit
Admission: RE | Admit: 2022-04-27 | Discharge: 2022-04-27 | Disposition: A | Discharge status: Home / Self Care | Payer: BC Managed Care – PPO | Source: Ambulatory Visit | Attending: Emergency Medicine | Admitting: Emergency Medicine

## 2022-04-27 VITALS — BP 123/76 | HR 99 | Temp 99.1°F | Resp 16

## 2022-04-27 DIAGNOSIS — J020 Streptococcal pharyngitis: Secondary | ICD-10-CM | POA: Diagnosis not present

## 2022-04-27 LAB — POCT RAPID STREP A (OFFICE): Rapid Strep A Screen: POSITIVE — AB

## 2022-04-27 MED ORDER — AMOXICILLIN 500 MG PO CAPS: 500.0000 mg | ORAL_CAPSULE | Freq: Two times a day (BID) | ORAL | 0 refills | Status: AC

## 2022-04-27 NOTE — ED Provider Notes (Signed)
[facility]    CSN: [id number] Arrival date & time: 04/27/22  1535    HISTORY   Chief Complaint  Patient presents with   Sore Throat    Entered by patient   HPI Tracey Gibson is a pleasant, 32 y.o. female who presents to urgent care today. Pt c/o sore throat that began in the middle of the night last night.  Patient reports returning from [place] yesterday.  Patient states she has had a low-grade temp at home but denies headache, nausea, vomiting, cough, nasal congestion.  Patient denies known sick contacts.  The history is provided by the patient.   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    Home Medications    Prior to Admission medications   Medication Sig Start Date End Date Taking? Authorizing Provider  amphetamine-dextroamphetamine (ADDERALL) 20 MG tablet Take 1 tablet (20 mg total) by mouth daily after breakfast. 03/04/22 04/03/22  Mozingo, Berdie Ogren, NP  amphetamine-dextroamphetamine (ADDERALL) 20 MG tablet Take 1 tablet (20 mg total) by mouth daily after breakfast. 04/01/22 05/01/22  Mozingo, Berdie Ogren, NP  amphetamine-dextroamphetamine (ADDERALL) 20 MG tablet Take 1 tablet (20 mg total) by mouth daily after breakfast. 04/29/22 05/29/22   Mozingo, Berdie Ogren, NP  fluvoxaMINE (LUVOX) 100 MG tablet Take 1 tablet (100 mg total) by mouth at bedtime. 06/08/21   Mozingo, Berdie Ogren, NP  PARAGARD INTRAUTERINE COPPER IU by Intrauterine route.    [provider]  traZODone (DESYREL) 100 MG tablet Take 1 tablet by mouth nightly at bedtime as needed for sleep. 06/08/21   Mozingo, Berdie Ogren, NP    Family History Family History  Problem Relation Age of Onset   Alcoholism Maternal Grandmother    Suicidality Maternal Grandfather    ADD / ADHD Maternal Aunt    Bipolar disorder Maternal Aunt    Alcohol abuse Maternal Aunt    Social History Social History   Tobacco Use   Smoking status: Never   Smokeless tobacco: Never  Vaping Use   Vaping Use: Former  Substance Use Topics   Alcohol use: Yes   Drug use: Not Currently    Types: Marijuana   Allergies   Patient has no known allergies.  Review of Systems Review of Systems Pertinent findings revealed after performing a 14 point review of systems has been noted in the history of present illness.  Physical Exam Vital Signs BP 123/76 (BP Location: Right Arm)   Pulse 99   Temp 99.1 F (37.3 C) (Oral)   Resp 16   SpO2 95%   No data found.  Physical Exam Constitutional:      General: She is not in acute distress.    Appearance: She is well-developed. She is ill-appearing. She is not toxic-appearing.  HENT:  Head: Normocephalic and atraumatic.     Salivary Glands: Right salivary gland is diffusely enlarged and tender. Left salivary gland is diffusely enlarged and tender.     Right Ear: Hearing and external ear normal.     Left Ear: Hearing and external ear normal.     Ears:     Comments: Bilateral EACs with mild erythema, bilateral TMs are normal    Nose: No mucosal edema, congestion or rhinorrhea.     Right Turbinates: Not enlarged, swollen or pale.     Left Turbinates: Not enlarged or swollen.     Right Sinus: No maxillary sinus tenderness or  frontal sinus tenderness.     Left Sinus: No maxillary sinus tenderness or frontal sinus tenderness.     Mouth/Throat:     Lips: Pink. No lesions.     Mouth: Mucous membranes are moist. No oral lesions or angioedema.     Dentition: No gingival swelling.     Tongue: No lesions.     Palate: No mass.     Pharynx: Uvula midline. Pharyngeal swelling, oropharyngeal exudate and posterior oropharyngeal erythema present. No uvula swelling.     Tonsils: Tonsillar exudate present. 2+ on the right. 2+ on the left.  Eyes:     Extraocular Movements: Extraocular movements intact.     Conjunctiva/sclera: Conjunctivae normal.     Pupils: Pupils are equal, round, and reactive to light.  Neck:     Thyroid: No thyroid mass, thyromegaly or thyroid tenderness.     Trachea: Tracheal tenderness present. No abnormal tracheal secretions or tracheal deviation.     Comments: Voice is muffled Cardiovascular:     Rate and Rhythm: Normal rate and regular rhythm.     Pulses: Normal pulses.     Heart sounds: Normal heart sounds, S1 normal and S2 normal. No murmur heard.    No friction rub. No gallop.  Pulmonary:     Effort: Pulmonary effort is normal. No accessory muscle usage, prolonged expiration, respiratory distress or retractions.     Breath sounds: No stridor, decreased air movement or transmitted upper airway sounds. No decreased breath sounds, wheezing, rhonchi or rales.  Abdominal:     General: Bowel sounds are normal.     Palpations: Abdomen is soft.     Tenderness: There is generalized abdominal tenderness. There is no right CVA tenderness, left CVA tenderness or rebound. Negative signs include Murphy's sign.     Hernia: No hernia is present.  Musculoskeletal:        General: No tenderness. Normal range of motion.     Cervical back: Full passive range of motion without pain, normal range of motion and neck supple.     Right lower leg: No edema.     Left lower leg: No edema.  Lymphadenopathy:      Cervical: Cervical adenopathy present.     Right cervical: Superficial cervical adenopathy present.     Left cervical: Superficial cervical adenopathy present.  Skin:    General: Skin is warm and dry.     Findings: No erythema, lesion or rash.  Neurological:     General: No focal deficit present.     Mental Status: She is alert and oriented to person, place, and time. Mental status is at baseline.  Psychiatric:        Mood and Affect: Mood normal.        Behavior: Behavior normal.        Thought Content: Thought content normal.  Judgment: Judgment normal.     Visual Acuity Right Eye Distance:   Left Eye Distance:   Bilateral Distance:    Right Eye Near:   Left Eye Near:    Bilateral Near:     UC Couse / Diagnostics / Procedures:     Radiology No results found.  Procedures Procedures (including critical care time) EKG  Pending results:  Labs Reviewed  POCT RAPID STREP A (OFFICE) - Abnormal; Notable for the following components:      Result Value   Rapid Strep A Screen Positive (*)    All other components within normal limits    Medications Ordered in UC: Medications - No data to display  UC Diagnoses / Final Clinical Impressions(s)   I have reviewed the triage vital signs and the nursing notes.  Pertinent labs & imaging results that were available during my care of the patient were reviewed by me and considered in my medical decision making (see chart for details).    Final diagnoses:  Acute streptococcal pharyngitis   Rapid strep test today is positive, patient advised.  Patient provided with a 10-day course of amoxicillin 500 mg twice daily.  Conservative care recommended.  Return precautions advised.  Please see discharge instructions below for further details of plan of care as provided to patient. ED Prescriptions     Medication Sig Dispense Auth. Provider   amoxicillin (AMOXIL) 500 MG capsule Take 1 capsule (500 mg total) by mouth 2 (two) times  daily for 10 days. 20 capsule Lynden Oxford Scales, PA-C      PDMP not reviewed this encounter.  Disposition Upon Discharge:  Condition: stable for discharge home Home: take medications as prescribed; routine discharge instructions as discussed; follow up as advised.  Patient presented with an acute illness with associated systemic symptoms and significant discomfort requiring urgent management. In my opinion, this is a condition that a prudent lay person (someone who possesses an average knowledge of health and medicine) may potentially expect to result in complications if not addressed urgently such as respiratory distress, impairment of bodily function or dysfunction of bodily organs.   Routine symptom specific, illness specific and/or disease specific instructions were discussed with the patient and/or caregiver at length.   As such, the patient has been evaluated and assessed, work-up was performed and treatment was provided in alignment with urgent care protocols and evidence based medicine.  Patient/parent/caregiver has been advised that the patient may require follow up for further testing and treatment if the symptoms continue in spite of treatment, as clinically indicated and appropriate.  If the patient was tested for COVID-19, Influenza and/or RSV, then the patient/parent/guardian was advised to isolate at home pending the results of his/her diagnostic coronavirus test and potentially longer if they're positive. I have also advised pt that if his/her COVID-19 test returns positive, it's recommended to self-isolate for at least 10 days after symptoms first appeared AND until fever-free for 24 hours without fever reducer AND other symptoms have improved or resolved. Discussed self-isolation recommendations as well as instructions for household member/close contacts as per the [facility] [place] DHHS, and also gave patient the [place] packet with this information.  Patient/parent/caregiver has  been advised to return to the [facility] or PCP in 3-5 days if no better; to PCP or the Emergency Department if new signs and symptoms develop, or if the current signs or symptoms continue to change or worsen for further workup, evaluation and treatment as clinically indicated and appropriate  The  patient will follow up with their current PCP if and as advised. If the patient does not currently have a PCP we will assist them in obtaining one.   The patient may need specialty follow up if the symptoms continue, in spite of conservative treatment and management, for further workup, evaluation, consultation and treatment as clinically indicated and appropriate.  Patient/parent/caregiver verbalized understanding and agreement of plan as discussed.  All questions were addressed during visit.  Please see discharge instructions below for further details of plan.  Discharge Instructions:   Discharge Instructions      Your strep test today is positive.  I recommend that you begin antibiotics now for treatment.  I have sent a prescription to your pharmacy.  Please take them as prescribed.  You will begin to feel better in about 24 hours.  Please be sure that you you finish the entire 10-day course of treatment to avoid worsening infection that may require longer treatment with stronger antibiotics.   After 24 hours of taking antibiotics, you are no longer contagious.  Please discard your toothbrush as well as any other oral devices that you are currently using and replace them with new ones to avoid reinfection.   Please read below to learn more about the medications, dosages and frequencies that I recommend to help alleviate your symptoms and to get you feeling better soon:   Amoxicillin:  Please take one (1) dose twice daily for 10 days.  This antibiotic can cause upset stomach, this will resolve once antibiotics are complete.  You are welcome to take a probiotic, eat yogurt, take Imodium while taking this  medication.  Please avoid other systemic medications such as Maalox, Pepto-Bismol or milk of magnesia as they can interfere with the body's ability to absorb the antibiotics.         Advil, Motrin (ibuprofen): This is a good anti-inflammatory medication which addresses aches, pains and inflammation of the upper airways that causes sinus and nasal congestion as well as in the lower airways which makes your cough feel tight and sometimes burn.  I recommend that you take between 400 to 600 mg every 6-8 hours as needed.      Chloraseptic Throat Spray: Spray 5 sprays into affected area every 2 hours, hold for 15 seconds and either swallow or spit it out.  This is a excellent numbing medication because it is a spray, you can put it right where you needed and so sucking on a lozenge and numbing your entire mouth.      Please follow-up within the next 5-7 days either with your primary care provider or urgent care if your symptoms do not resolve.  If you do not have a primary care provider, we will assist you in finding one.        Thank you for visiting urgent care today.  We appreciate the opportunity to participate in your care.       This office note has been dictated using Museum/gallery curator.  Unfortunately, this method of dictation can sometimes lead to typographical or grammatical errors.  I apologize for your inconvenience in advance if this occurs.  Please do not hesitate to reach out to me if clarification is needed.      Lynden Oxford Scales, [place] 04/27/22 [phone]

## 2022-04-27 NOTE — Discharge Instructions (Signed)
Your strep test today is positive.  I recommend that you begin antibiotics now for treatment.  I have sent a prescription to your pharmacy.  Please take them as prescribed.  You will begin to feel better in about 24 hours.  Please be sure that you you finish the entire 10-day course of treatment to avoid worsening infection that may require longer treatment with stronger antibiotics.   After 24 hours of taking antibiotics, you are no longer contagious.  Please discard your toothbrush as well as any other oral devices that you are currently using and replace them with new ones to avoid reinfection.   Please read below to learn more about the medications, dosages and frequencies that I recommend to help alleviate your symptoms and to get you feeling better soon:   Amoxicillin:  Please take one (1) dose twice daily for 10 days.  This antibiotic can cause upset stomach, this will resolve once antibiotics are complete.  You are welcome to take a probiotic, eat yogurt, take Imodium while taking this medication.  Please avoid other systemic medications such as Maalox, Pepto-Bismol or milk of magnesia as they can interfere with the body's ability to absorb the antibiotics.         Advil, Motrin (ibuprofen): This is a good anti-inflammatory medication which addresses aches, pains and inflammation of the upper airways that causes sinus and nasal congestion as well as in the lower airways which makes your cough feel tight and sometimes burn.  I recommend that you take between 400 to 600 mg every 6-8 hours as needed.      Chloraseptic Throat Spray: Spray 5 sprays into affected area every 2 hours, hold for 15 seconds and either swallow or spit it out.  This is a excellent numbing medication because it is a spray, you can put it right where you needed and so sucking on a lozenge and numbing your entire mouth.      Please follow-up within the next 5-7 days either with your primary care provider or urgent care if your  symptoms do not resolve.  If you do not have a primary care provider, we will assist you in finding one.        Thank you for visiting urgent care today.  We appreciate the opportunity to participate in your care.

## 2022-04-27 NOTE — ED Triage Notes (Signed)
Pt presents with c/o sore throat. Recent travel to [place]. Low grade temp at home

## 2022-04-28 ENCOUNTER — Telehealth: Payer: Self-pay | Admitting: Emergency Medicine

## 2022-04-28 DIAGNOSIS — F4323 Adjustment disorder with mixed anxiety and depressed mood: Secondary | ICD-10-CM | POA: Diagnosis not present

## 2022-05-05 DIAGNOSIS — F4323 Adjustment disorder with mixed anxiety and depressed mood: Secondary | ICD-10-CM | POA: Diagnosis not present

## 2022-05-12 DIAGNOSIS — F4323 Adjustment disorder with mixed anxiety and depressed mood: Secondary | ICD-10-CM | POA: Diagnosis not present

## 2022-05-26 DIAGNOSIS — F4323 Adjustment disorder with mixed anxiety and depressed mood: Secondary | ICD-10-CM | POA: Diagnosis not present

## 2022-06-02 DIAGNOSIS — F4323 Adjustment disorder with mixed anxiety and depressed mood: Secondary | ICD-10-CM | POA: Diagnosis not present

## 2022-06-09 DIAGNOSIS — F4323 Adjustment disorder with mixed anxiety and depressed mood: Secondary | ICD-10-CM | POA: Diagnosis not present

## 2022-06-16 ENCOUNTER — Ambulatory Visit (INDEPENDENT_AMBULATORY_CARE_PROVIDER_SITE_OTHER): Payer: BC Managed Care – PPO | Admitting: Adult Health

## 2022-06-16 ENCOUNTER — Telehealth: Payer: Self-pay | Admitting: Psychiatry

## 2022-06-16 ENCOUNTER — Encounter: Payer: Self-pay | Admitting: Adult Health

## 2022-06-16 DIAGNOSIS — F900 Attention-deficit hyperactivity disorder, predominantly inattentive type: Secondary | ICD-10-CM | POA: Diagnosis not present

## 2022-06-16 DIAGNOSIS — F4312 Post-traumatic stress disorder, chronic: Secondary | ICD-10-CM | POA: Diagnosis not present

## 2022-06-16 DIAGNOSIS — F341 Dysthymic disorder: Secondary | ICD-10-CM

## 2022-06-16 MED ORDER — CLONAZEPAM 0.5 MG PO TABS
ORAL_TABLET | ORAL | 0 refills | Status: DC
Start: 2022-06-16 — End: 2022-06-16

## 2022-06-16 MED ORDER — AMPHETAMINE-DEXTROAMPHETAMINE 20 MG PO TABS: 20.0000 mg | ORAL_TABLET | Freq: Every day | ORAL | 0 refills | Status: DC

## 2022-06-16 MED ORDER — FLUVOXAMINE MALEATE 100 MG PO TABS: 100.0000 mg | ORAL_TABLET | Freq: Every day | ORAL | 3 refills | Status: DC

## 2022-06-16 MED ORDER — TRAZODONE HCL 100 MG PO TABS: ORAL_TABLET | ORAL | 3 refills | Status: DC

## 2022-06-16 MED ORDER — AMPHETAMINE-DEXTROAMPHETAMINE 20 MG PO TABS
20.0000 mg | ORAL_TABLET | Freq: Every day | ORAL | 0 refills | Status: DC
Start: 2022-06-16 — End: 2022-08-18

## 2022-06-16 MED ORDER — CLONAZEPAM 0.5 MG PO TABS: 0.5000 mg | ORAL_TABLET | Freq: Every evening | ORAL | 0 refills | Status: DC | PRN

## 2022-06-16 NOTE — Telephone Encounter (Signed)
Spoke with the patient via phone. The pharmacy the clonazepam was sent to did not carry the medicine. I've resent this to another pharmacy in the area per patient preference. Same instructions as before - clonazepam 0.5mg  qhs prn for sleep (#10 tablets with 0 refills). No other concerns voiced. No safety concerns.

## 2022-06-16 NOTE — Progress Notes (Signed)
Tracey Gibson [id number] Feb 07, 1991 32 y.o.  Subjective:   Patient ID:  Tracey Gibson is a 32 y.o. (DOB 1990-12-19) female.  Chief Complaint: No chief complaint on file.   HPI Zelene FLEETA KUNDE presents to the office today for follow-up of ADHD, OCD, PTSD, and MDD.  Describes mood today as "ok". Pleasant. Mood symptoms - reports depression and anxiety - more circumstantial. Decreased irritability. Reports worry, rumination, and over thinking. Reports some obsessive thoughts and acts. Mood is lower. Recently lost her cousin to an overdose - in her apartment. Stating "I'm not doing too good". Staying with mother temporarily. Planning to move back to [place]. Feels like current medications are helpful. Stable interest and motivation. Taking medications as prescribed.  Energy levels lower. Active, does not have a regular exercise routine.  Enjoys some usual interests and activities. Single. Lives alone in an apartment with cat and a dog. Father local. Mother recently moved to [facility]. Spending time with family. Appetite adequate. Weight gain. Reports recent sleep issues.  Focus and concentration improved when taking Adderall. Completing tasks. Managing aspects of household. 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  Denies AH or VH. Denies self harm. Denies substance use.  GAD-7    Flowsheet Row Office Visit from 08/09/2019 in [facility] Visit from 01/30/2018 in [facility] [employer]  Total GAD-7 Score 0 1      PHQ2-9    Flowsheet Row Office Visit from 08/09/2019 in [facility] Visit from 01/30/2018 in [facility] Total Score 0 1  PHQ-9 Total Score 1 3      Flowsheet Row ED from 04/27/2022 in [facility])  C-SSRS RISK CATEGORY No Risk        Review of Systems:   Review of Systems  Musculoskeletal:  Negative for gait problem.  Neurological:  Negative for tremors.  Psychiatric/Behavioral:         Please refer to HPI    Medications: I have reviewed the patient's current medications.  Current Outpatient Medications  Medication Sig Dispense Refill   amphetamine-dextroamphetamine (ADDERALL) 20 MG tablet Take 1 tablet (20 mg total) by mouth daily after breakfast. 30 tablet 0   amphetamine-dextroamphetamine (ADDERALL) 20 MG tablet Take 1 tablet (20 mg total) by mouth daily after breakfast. 30 tablet 0   amphetamine-dextroamphetamine (ADDERALL) 20 MG tablet Take 1 tablet (20 mg total) by mouth daily after breakfast. 30 tablet 0   fluvoxaMINE (LUVOX) 100 MG tablet Take 1 tablet (100 mg total) by mouth at bedtime. 90 tablet 3   PARAGARD INTRAUTERINE COPPER IU by Intrauterine route. (Patient not taking: Reported on 04/27/2022)     traZODone (DESYREL) 100 MG tablet Take 1 tablet by mouth nightly at bedtime as needed for sleep. 90 tablet 3   No current facility-administered medications for this visit.    Medication Side Effects: None  Allergies: No Known Allergies  Past Medical History:  Diagnosis Date   ADHD    Anxiety    Depression     Past Medical History, Surgical history, Social history, and Family history were reviewed and updated as appropriate.   Please see review of systems for further details on the patient's review from today.   Objective:   Physical Exam:  There were no vitals taken for this visit.  Physical Exam Constitutional:  General: She is not in acute distress. Musculoskeletal:        General: No deformity.  Neurological:     Mental Status: She is alert and oriented to person, place, and time.     Coordination: Coordination normal.  Psychiatric:        Attention and Perception: Attention and perception normal. She does not perceive auditory or visual hallucinations.        Mood and Affect: Mood normal. Mood is not  anxious or depressed. Affect is not labile, blunt, angry or inappropriate.        Speech: Speech normal.        Behavior: Behavior normal.        Thought Content: Thought content normal. Thought content is not paranoid or delusional. Thought content does not include homicidal or suicidal ideation. Thought content does not include homicidal or suicidal plan.        Cognition and Memory: Cognition and memory normal.        Judgment: Judgment normal.     Comments: Insight intact     Lab Review:     Component Value Date/Time   NA 135 12/05/2019 1427   NA 137 08/19/2016 0934   K 3.5 12/05/2019 1427   CL 100 12/05/2019 1427   CO2 26 12/05/2019 1427   GLUCOSE 87 12/05/2019 1427   BUN 10 12/05/2019 1427   BUN 7 08/19/2016 0934   CREATININE 0.79 12/05/2019 1427   CALCIUM 9.2 12/05/2019 1427   PROT 7.0 08/19/2016 0934   ALBUMIN 4.8 08/19/2016 0934   AST 16 12/05/2019 1427   ALT 16 12/05/2019 1427   ALKPHOS 35 (L) 08/19/2016 0934   BILITOT 0.3 08/19/2016 0934   GFRNONAA 116 08/19/2016 0934   GFRAA 133 08/19/2016 0934       Component Value Date/Time   WBC 7.2 12/05/2019 1427   RBC 4.24 12/05/2019 1427   HGB 13.5 12/05/2019 1427   HGB 12.5 08/19/2016 0934   HCT 39.7 12/05/2019 1427   HCT 37.7 08/19/2016 0934   PLT 344.0 12/05/2019 1427   PLT 253 08/19/2016 0934   MCV 93.6 12/05/2019 1427   MCV 92 08/19/2016 0934   MCH 30.6 08/19/2016 0934   MCHC 34.1 12/05/2019 1427   RDW 13.2 12/05/2019 1427   RDW 13.1 08/19/2016 0934   LYMPHSABS 1.9 08/19/2016 0934   EOSABS 0.4 08/19/2016 0934   BASOSABS 0.0 08/19/2016 0934    No results found for: "POCLITH", "LITHIUM"   No results found for: "PHENYTOIN", "PHENOBARB", "VALPROATE", "CBMZ"   .res Assessment: Plan:    Plan:  PDMP reviewed  Adderall  daily Luvox  daily Trazadone  at hs  Add Clonazepam 0.5mg  at hs for sleep - 10 tablets.  Check BP between visits  Discussed potential benefits, risks, and side  effects of stimulants with patient to include increased heart rate, palpitations, insomnia, increased anxiety, increased irritability, or decreased appetite.  Instructed patient to contact office if experiencing any significant tolerability issues.   RTC 3 months  Patient advised to contact office with any questions, adverse effects, or acute worsening in signs and symptoms.  There are no diagnoses linked to this encounter.   Please see After Visit Summary for patient specific instructions.  No future appointments.  No orders of the defined types were placed in this encounter.   -------------------------------

## 2022-06-17 DIAGNOSIS — F4323 Adjustment disorder with mixed anxiety and depressed mood: Secondary | ICD-10-CM | POA: Diagnosis not present

## 2022-06-17 NOTE — Telephone Encounter (Signed)
Noted. Ty!

## 2022-06-23 DIAGNOSIS — F4323 Adjustment disorder with mixed anxiety and depressed mood: Secondary | ICD-10-CM | POA: Diagnosis not present

## 2022-06-30 DIAGNOSIS — F4323 Adjustment disorder with mixed anxiety and depressed mood: Secondary | ICD-10-CM | POA: Diagnosis not present

## 2022-07-07 DIAGNOSIS — F4323 Adjustment disorder with mixed anxiety and depressed mood: Secondary | ICD-10-CM | POA: Diagnosis not present

## 2022-07-15 ENCOUNTER — Ambulatory Visit (INDEPENDENT_AMBULATORY_CARE_PROVIDER_SITE_OTHER): Payer: Self-pay | Admitting: Adult Health

## 2022-07-15 DIAGNOSIS — Z0389 Encounter for observation for other suspected diseases and conditions ruled out: Secondary | ICD-10-CM

## 2022-07-15 NOTE — Progress Notes (Signed)
Patient no show appointment. ? ?

## 2022-07-19 ENCOUNTER — Telehealth: Payer: Self-pay | Admitting: Adult Health

## 2022-07-19 NOTE — Telephone Encounter (Signed)
Pt called requesting ESA letter needs to be updated to include her cat Samora. Need ASAP. Contact pt @ [phone]. Her apartment complex needs ASAP.

## 2022-07-19 NOTE — Telephone Encounter (Signed)
Letter completed. Patient notified. Put at front desk.

## 2022-07-19 NOTE — Telephone Encounter (Signed)
We do have the previous letter. I have contacted patient  - she now has 1 dog and 1 cat. I will update letter and give to you to sign. Patient will pick up when ready.

## 2022-07-19 NOTE — Telephone Encounter (Signed)
Noted. Ty!

## 2022-07-19 NOTE — Telephone Encounter (Signed)
Do we have a copy of the original letter scanned or in the file?

## 2022-07-20 DIAGNOSIS — N3001 Acute cystitis with hematuria: Secondary | ICD-10-CM | POA: Diagnosis not present

## 2022-07-20 DIAGNOSIS — N898 Other specified noninflammatory disorders of vagina: Secondary | ICD-10-CM | POA: Diagnosis not present

## 2022-07-20 DIAGNOSIS — N760 Acute vaginitis: Secondary | ICD-10-CM | POA: Diagnosis not present

## 2022-07-20 DIAGNOSIS — B9689 Other specified bacterial agents as the cause of diseases classified elsewhere: Secondary | ICD-10-CM | POA: Diagnosis not present

## 2022-07-21 DIAGNOSIS — F4323 Adjustment disorder with mixed anxiety and depressed mood: Secondary | ICD-10-CM | POA: Diagnosis not present

## 2022-07-28 DIAGNOSIS — F4323 Adjustment disorder with mixed anxiety and depressed mood: Secondary | ICD-10-CM | POA: Diagnosis not present

## 2022-07-29 ENCOUNTER — Telehealth: Payer: Self-pay | Admitting: Adult Health

## 2022-07-29 DIAGNOSIS — Z0289 Encounter for other administrative examinations: Secondary | ICD-10-CM

## 2022-07-29 NOTE — Telephone Encounter (Signed)
Pt brought in information re: letter she needs for ESA. Pt has paid $15 and details for letter is in Mozingo's box.

## 2022-07-29 NOTE — Telephone Encounter (Signed)
Will complete letter.

## 2022-08-09 ENCOUNTER — Ambulatory Visit (INDEPENDENT_AMBULATORY_CARE_PROVIDER_SITE_OTHER): Payer: Self-pay | Admitting: Adult Health

## 2022-08-09 DIAGNOSIS — Z0389 Encounter for observation for other suspected diseases and conditions ruled out: Secondary | ICD-10-CM

## 2022-08-09 NOTE — Progress Notes (Signed)
Patient no show appointment. ? ?

## 2022-08-11 DIAGNOSIS — F4323 Adjustment disorder with mixed anxiety and depressed mood: Secondary | ICD-10-CM | POA: Diagnosis not present

## 2022-08-18 ENCOUNTER — Ambulatory Visit: Payer: BC Managed Care – PPO | Admitting: Adult Health

## 2022-08-18 ENCOUNTER — Encounter: Payer: Self-pay | Admitting: Adult Health

## 2022-08-18 DIAGNOSIS — F341 Dysthymic disorder: Secondary | ICD-10-CM

## 2022-08-18 DIAGNOSIS — F900 Attention-deficit hyperactivity disorder, predominantly inattentive type: Secondary | ICD-10-CM

## 2022-08-18 DIAGNOSIS — F428 Other obsessive-compulsive disorder: Secondary | ICD-10-CM

## 2022-08-18 DIAGNOSIS — F4312 Post-traumatic stress disorder, chronic: Secondary | ICD-10-CM

## 2022-08-18 MED ORDER — AMPHETAMINE-DEXTROAMPHETAMINE 20 MG PO TABS
20.0000 mg | ORAL_TABLET | Freq: Every day | ORAL | 0 refills | Status: DC
Start: 2022-10-13 — End: 2023-03-01

## 2022-08-18 MED ORDER — AMPHETAMINE-DEXTROAMPHETAMINE 20 MG PO TABS
20.0000 mg | ORAL_TABLET | Freq: Every day | ORAL | 0 refills | Status: DC
Start: 2022-09-15 — End: 2023-03-01

## 2022-08-18 MED ORDER — AMPHETAMINE-DEXTROAMPHETAMINE 20 MG PO TABS: 20.0000 mg | ORAL_TABLET | Freq: Every day | ORAL | 0 refills | Status: DC

## 2022-08-18 MED ORDER — AMPHETAMINE-DEXTROAMPHETAMINE 20 MG PO TABS
20.0000 mg | ORAL_TABLET | Freq: Every day | ORAL | 0 refills | Status: DC
Start: 2022-08-18 — End: 2023-03-01

## 2022-08-18 MED ORDER — AMPHETAMINE-DEXTROAMPHETAMINE 20 MG PO TABS
20.0000 mg | ORAL_TABLET | Freq: Every day | ORAL | 0 refills | Status: DC
Start: 2022-09-15 — End: 2022-08-18

## 2022-08-18 NOTE — Progress Notes (Signed)
[name] [id number] [birth year] 32 y.o.  Subjective:   Patient ID:  Tracey Gibson is a 31 y.o. (DOB [birth year]) female.  Chief Complaint: No chief complaint on file.   HPI Lanier ISRAA STOCKMANN presents to the office today for follow-up of ADHD, OCD, PTSD, and MDD.  Describes mood today as "ok". Pleasant. Mood symptoms - denies depression and anxiety - reports "moments". Decreased irritability. Reports worry, rumination, and over thinking. Reports some obsessive thoughts and acts. Mood has improved. Reports some struggles with loss of cousin, but is working through. Stating "I'm doing better". Has moved into an apartment in [place]. Feels like current medications are helpful. Stable interest and motivation. Taking medications as prescribed.  Energy levels lower. Active, does not have a regular exercise routine.  Enjoys some usual interests and activities. Single. Lives alone in an apartment with cat and a dog. Father local. Mother recently moved to [facility]. Spending time with family. Appetite adequate. Weight gain. Sleep has improved. Averages 8 hours a night. Focus and concentration stable with Adderall. Completing tasks. Managing aspects of household. 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  Denies AH or VH. Denies self harm. Denies substance use.   GAD-7    Flowsheet Row Office Visit from 08/09/2019 in [facility] Visit from 01/30/2018 in [facility] at [facility]  Total GAD-7 Score 0 1      PHQ2-9    Flowsheet Row Office Visit from 08/09/2019 in [facility] Visit from 01/30/2018 in [facility] at [facility] Total Score 0 1  PHQ-9 Total Score 1 3      Flowsheet Row ED from 04/27/2022 in [facility])  C-SSRS RISK CATEGORY No Risk        Review of Systems:  Review of  Systems  Musculoskeletal:  Negative for gait problem.  Neurological:  Negative for tremors.  Psychiatric/Behavioral:         Please refer to HPI    Medications: I have reviewed the patient's current medications.  Current Outpatient Medications  Medication Sig Dispense Refill   amphetamine-dextroamphetamine (ADDERALL) 20 MG tablet Take 1 tablet (20 mg total) by mouth daily after breakfast. 30 tablet 0   [START ON 09/15/2022] amphetamine-dextroamphetamine (ADDERALL) 20 MG tablet Take 1 tablet (20 mg total) by mouth daily after breakfast. 30 tablet 0   [START ON 10/13/2022] amphetamine-dextroamphetamine (ADDERALL) 20 MG tablet Take 1 tablet (20 mg total) by mouth daily after breakfast. 30 tablet 0   clonazePAM (KLONOPIN) 0.5 MG tablet Take 1 tablet (0.5 mg total) by mouth at bedtime as needed for anxiety. 10 tablet 0   fluvoxaMINE (LUVOX) 100 MG tablet Take 1 tablet (100 mg total) by mouth at bedtime. 90 tablet 3   PARAGARD INTRAUTERINE COPPER IU by Intrauterine route. (Patient not taking: Reported on 04/27/2022)     traZODone (DESYREL) 100 MG tablet Take 1 tablet by mouth nightly at bedtime as needed for sleep. 90 tablet 3   No current facility-administered medications for this visit.    Medication Side Effects: None  Allergies: No Known Allergies  Past Medical History:  Diagnosis Date   ADHD    Anxiety    Depression     Past Medical History, Surgical history, Social history, and Family history were reviewed and updated as appropriate.   Please see review of systems for further  details on the patient's review from today.   Objective:   Physical Exam:  There were no vitals taken for this visit.  Physical Exam Constitutional:      General: She is not in acute distress. Musculoskeletal:        General: No deformity.  Neurological:     Mental Status: She is alert and oriented to person, place, and time.     Coordination: Coordination normal.  Psychiatric:        Attention  and Perception: Attention and perception normal. She does not perceive auditory or visual hallucinations.        Mood and Affect: Mood normal. Mood is not anxious or depressed. Affect is not labile, blunt, angry or inappropriate.        Speech: Speech normal.        Behavior: Behavior normal.        Thought Content: Thought content normal. Thought content is not paranoid or delusional. Thought content does not include homicidal or suicidal ideation. Thought content does not include homicidal or suicidal plan.        Cognition and Memory: Cognition and memory normal.        Judgment: Judgment normal.     Comments: Insight intact     Lab Review:     Component Value Date/Time   NA 135 12/05/2019 1427   NA 137 08/19/2016 0934   K 3.5 12/05/2019 1427   CL 100 12/05/2019 1427   CO2 26 12/05/2019 1427   GLUCOSE 87 12/05/2019 1427   BUN 10 12/05/2019 1427   BUN 7 08/19/2016 0934   CREATININE 0.79 12/05/2019 1427   CALCIUM 9.2 12/05/2019 1427   PROT 7.0 08/19/2016 0934   ALBUMIN 4.8 08/19/2016 0934   AST 16 12/05/2019 1427   ALT 16 12/05/2019 1427   ALKPHOS 35 (L) 08/19/2016 0934   BILITOT 0.3 08/19/2016 0934   GFRNONAA 116 08/19/2016 0934   GFRAA 133 08/19/2016 0934       Component Value Date/Time   WBC 7.2 12/05/2019 1427   RBC 4.24 12/05/2019 1427   HGB 13.5 12/05/2019 1427   HGB 12.5 08/19/2016 0934   HCT 39.7 12/05/2019 1427   HCT 37.7 08/19/2016 0934   PLT 344.0 12/05/2019 1427   PLT 253 08/19/2016 0934   MCV 93.6 12/05/2019 1427   MCV 92 08/19/2016 0934   MCH 30.6 08/19/2016 0934   MCHC 34.1 12/05/2019 1427   RDW 13.2 12/05/2019 1427   RDW 13.1 08/19/2016 0934   LYMPHSABS 1.9 08/19/2016 0934   EOSABS 0.4 08/19/2016 0934   BASOSABS 0.0 08/19/2016 0934    No results found for: "POCLITH", "LITHIUM"   No results found for: "PHENYTOIN", "PHENOBARB", "VALPROATE", "CBMZ"   .res Assessment: Plan:    Plan:  PDMP reviewed  Adderall 20mg  daily Luvox 100mg   daily Trazadone 100mg  at hs  Add Clonazepam 0.5mg  at hs for sleep - 10 tablets.  Check BP between visits  Discussed potential benefits, risks, and side effects of stimulants with patient to include increased heart rate, palpitations, insomnia, increased anxiety, increased irritability, or decreased appetite.  Instructed patient to contact office if experiencing any significant tolerability issues.   RTC 3 months  Patient advised to contact office with any questions, adverse effects, or acute worsening in signs and symptoms.  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 -     Discontinue: amphetamine-dextroamphetamine (ADDERALL) 20 MG tablet; Take 1 tablet (20 mg total) by mouth daily after breakfast. -     Discontinue: amphetamine-dextroamphetamine (ADDERALL) 20 MG tablet; Take 1 tablet (20 mg total) by mouth daily after breakfast. -     amphetamine-dextroamphetamine (ADDERALL) 20 MG tablet; Take 1 tablet (20 mg total) by mouth daily after breakfast. -     amphetamine-dextroamphetamine (ADDERALL) 20 MG tablet; Take 1 tablet (20 mg total) by mouth daily after breakfast. -     amphetamine-dextroamphetamine (ADDERALL) 20 MG tablet; Take 1 tablet (20 mg total) by mouth daily after breakfast.  Chronic post-traumatic stress disorder  Other obsessive-compulsive disorders     Please see After Visit Summary for patient specific instructions.  No future appointments.  No orders of the defined types were placed in this encounter.   -------------------------------

## 2022-08-25 DIAGNOSIS — F4323 Adjustment disorder with mixed anxiety and depressed mood: Secondary | ICD-10-CM | POA: Diagnosis not present

## 2022-09-15 DIAGNOSIS — F4323 Adjustment disorder with mixed anxiety and depressed mood: Secondary | ICD-10-CM | POA: Diagnosis not present

## 2022-09-28 DIAGNOSIS — Z113 Encounter for screening for infections with a predominantly sexual mode of transmission: Secondary | ICD-10-CM | POA: Diagnosis not present

## 2022-09-28 DIAGNOSIS — Z30430 Encounter for insertion of intrauterine contraceptive device: Secondary | ICD-10-CM | POA: Diagnosis not present

## 2022-09-30 ENCOUNTER — Other Ambulatory Visit: Payer: Self-pay

## 2022-09-30 DIAGNOSIS — F4312 Post-traumatic stress disorder, chronic: Secondary | ICD-10-CM

## 2022-09-30 DIAGNOSIS — F341 Dysthymic disorder: Secondary | ICD-10-CM

## 2022-09-30 MED ORDER — FLUVOXAMINE MALEATE 100 MG PO TABS
100.0000 mg | ORAL_TABLET | Freq: Every day | ORAL | 3 refills | Status: DC
Start: 2022-09-30 — End: 2023-09-01

## 2022-10-13 DIAGNOSIS — F4323 Adjustment disorder with mixed anxiety and depressed mood: Secondary | ICD-10-CM | POA: Diagnosis not present

## 2022-10-14 DIAGNOSIS — A499 Bacterial infection, unspecified: Secondary | ICD-10-CM | POA: Diagnosis not present

## 2022-10-14 DIAGNOSIS — N761 Subacute and chronic vaginitis: Secondary | ICD-10-CM | POA: Diagnosis not present

## 2022-11-17 DIAGNOSIS — F4323 Adjustment disorder with mixed anxiety and depressed mood: Secondary | ICD-10-CM | POA: Diagnosis not present

## 2022-11-18 ENCOUNTER — Ambulatory Visit (INDEPENDENT_AMBULATORY_CARE_PROVIDER_SITE_OTHER): Payer: BC Managed Care – PPO | Admitting: Adult Health

## 2022-11-25 ENCOUNTER — Ambulatory Visit: Payer: BC Managed Care – PPO | Admitting: Adult Health

## 2022-12-15 DIAGNOSIS — F4323 Adjustment disorder with mixed anxiety and depressed mood: Secondary | ICD-10-CM | POA: Diagnosis not present

## 2023-01-19 DIAGNOSIS — F4323 Adjustment disorder with mixed anxiety and depressed mood: Secondary | ICD-10-CM | POA: Diagnosis not present

## 2023-01-20 DIAGNOSIS — Z23 Encounter for immunization: Secondary | ICD-10-CM | POA: Diagnosis not present

## 2023-01-20 DIAGNOSIS — Z79899 Other long term (current) drug therapy: Secondary | ICD-10-CM | POA: Diagnosis not present

## 2023-01-20 DIAGNOSIS — Z0000 Encounter for general adult medical examination without abnormal findings: Secondary | ICD-10-CM | POA: Diagnosis not present

## 2023-01-20 DIAGNOSIS — F17290 Nicotine dependence, other tobacco product, uncomplicated: Secondary | ICD-10-CM | POA: Diagnosis not present

## 2023-01-20 DIAGNOSIS — E7800 Pure hypercholesterolemia, unspecified: Secondary | ICD-10-CM | POA: Diagnosis not present

## 2023-01-20 DIAGNOSIS — K219 Gastro-esophageal reflux disease without esophagitis: Secondary | ICD-10-CM | POA: Diagnosis not present

## 2023-02-09 DIAGNOSIS — F4323 Adjustment disorder with mixed anxiety and depressed mood: Secondary | ICD-10-CM | POA: Diagnosis not present

## 2023-03-01 ENCOUNTER — Ambulatory Visit: Payer: BC Managed Care – PPO | Admitting: Adult Health

## 2023-03-01 ENCOUNTER — Encounter: Payer: Self-pay | Admitting: Adult Health

## 2023-03-01 DIAGNOSIS — F4312 Post-traumatic stress disorder, chronic: Secondary | ICD-10-CM | POA: Diagnosis not present

## 2023-03-01 DIAGNOSIS — F900 Attention-deficit hyperactivity disorder, predominantly inattentive type: Secondary | ICD-10-CM

## 2023-03-01 DIAGNOSIS — F428 Other obsessive-compulsive disorder: Secondary | ICD-10-CM

## 2023-03-01 DIAGNOSIS — F331 Major depressive disorder, recurrent, moderate: Secondary | ICD-10-CM

## 2023-03-01 MED ORDER — METHYLPHENIDATE HCL ER (OSM) 27 MG PO TBCR: 27.0000 mg | EXTENDED_RELEASE_TABLET | ORAL | 0 refills | Status: DC

## 2023-03-01 NOTE — Progress Notes (Signed)
 Tracey Gibson [id number] [birth year] 32 y.o.  Subjective:   Patient ID:  Tracey Gibson is a 32 y.o. (DOB 1990/11/18) female.  Chief Complaint: No chief complaint on file.   HPI Tracey Gibson presents to the office today for follow-up of ADHD, OCD, PTSD, and MDD.  Describes mood today as ok. Pleasant. Mood symptoms - denies depression, anxiety, and irritability. Improved interest and motivation. Denies panic attacks. Denies worry, rumination, and over thinking. Denies obsessive thoughts and acts. Mood has improved. Stating I'm doing better. Has stopped Adderall due to it making her want to drink alcohol. Stopped alcohol 2 weeks ago and has not drank as much. Would like to consider other options. Taking medications as prescribed.  Energy levels improved. Active, does not have a regular exercise routine.  Enjoys some usual interests and activities. Dating - has a boyfriend. Lives alone in an apartment with cat and a dog. Father local. Mother recently moved to [facility]. Spending time with family. Appetite adequate. Weight stable. Sleep has improved. Averages 8 hours a night. Focus and concentration stable with Adderall. Completing tasks. Managing aspects of household. 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  Denies AH or VH. Denies self harm. Denies substance use. Reports alcohol use.    GAD-7    Flowsheet Row Office Visit from 08/09/2019 in [facility] Visit from 01/30/2018 in [facility] at [employer] [facility]  Total GAD-7 Score 0 1      PHQ2-9    Flowsheet Row Office Visit from 08/09/2019 in [facility] Visit from 01/30/2018 in [facility] Total Score 0 1  PHQ-9 Total Score 1 3      Flowsheet Row ED from 04/27/2022 in [facility] at [facility])  C-SSRS RISK CATEGORY No Risk         Review of Systems:  Review of Systems  Musculoskeletal:  Negative for gait problem.  Neurological:  Negative for tremors.  Psychiatric/Behavioral:         Please refer to HPI    Medications: I have reviewed the patient's current medications.  Current Outpatient Medications  Medication Sig Dispense Refill   methylphenidate  (CONCERTA ) 27 MG PO CR tablet Take 1 tablet (27 mg total) by mouth every morning. 30 tablet 0   clonazePAM  (KLONOPIN ) 0.5 MG tablet Take 1 tablet (0.5 mg total) by mouth at bedtime as needed for anxiety. 10 tablet 0   fluvoxaMINE  (LUVOX ) 100 MG tablet Take 1 tablet (100 mg total) by mouth at bedtime. 90 tablet 3   PARAGARD  INTRAUTERINE COPPER  IU by Intrauterine route. (Patient not taking: Reported on 04/27/2022)     traZODone  (DESYREL ) 100 MG tablet Take 1 tablet by mouth nightly at bedtime as needed for sleep. 90 tablet 3   No current facility-administered medications for this visit.    Medication Side Effects: None  Allergies: No Known Allergies  Past Medical History:  Diagnosis Date   ADHD    Anxiety    Depression     Past Medical History, Surgical history, Social history, and Family history were reviewed and updated as appropriate.   Please see review of systems for further details on the patient's review from today.   Objective:   Physical Exam:  There were no vitals taken for this visit.  Physical Exam Constitutional:      General: She is not  in acute distress. Musculoskeletal:        General: No deformity.  Neurological:     Mental Status: She is alert and oriented to person, place, and time.     Coordination: Coordination normal.  Psychiatric:        Attention and Perception: Attention and perception normal. She does not perceive auditory or visual hallucinations.        Mood and Affect: Affect is not labile, blunt, angry or inappropriate.        Speech: Speech normal.        Behavior: Behavior normal.        Thought Content: Thought  content normal. Thought content is not paranoid or delusional. Thought content does not include homicidal or suicidal ideation. Thought content does not include homicidal or suicidal plan.        Cognition and Memory: Cognition and memory normal.        Judgment: Judgment normal.     Comments: Insight intact     Lab Review:     Component Value Date/Time   NA 135 12/05/2019 1427   NA 137 08/19/2016 0934   K 3.5 12/05/2019 1427   CL 100 12/05/2019 1427   CO2 26 12/05/2019 1427   GLUCOSE 87 12/05/2019 1427   BUN 10 12/05/2019 1427   BUN 7 08/19/2016 0934   CREATININE 0.79 12/05/2019 1427   CALCIUM 9.2 12/05/2019 1427   PROT 7.0 08/19/2016 0934   ALBUMIN 4.8 08/19/2016 0934   AST 16 12/05/2019 1427   ALT 16 12/05/2019 1427   ALKPHOS 35 (L) 08/19/2016 0934   BILITOT 0.3 08/19/2016 0934   GFRNONAA 116 08/19/2016 0934   GFRAA 133 08/19/2016 0934       Component Value Date/Time   WBC 7.2 12/05/2019 1427   RBC 4.24 12/05/2019 1427   HGB 13.5 12/05/2019 1427   HGB 12.5 08/19/2016 0934   HCT 39.7 12/05/2019 1427   HCT 37.7 08/19/2016 0934   PLT 344.0 12/05/2019 1427   PLT 253 08/19/2016 0934   MCV 93.6 12/05/2019 1427   MCV 92 08/19/2016 0934   MCH 30.6 08/19/2016 0934   MCHC 34.1 12/05/2019 1427   RDW 13.2 12/05/2019 1427   RDW 13.1 08/19/2016 0934   LYMPHSABS 1.9 08/19/2016 0934   EOSABS 0.4 08/19/2016 0934   BASOSABS 0.0 08/19/2016 0934    No results found for: POCLITH, LITHIUM   No results found for: PHENYTOIN, PHENOBARB, VALPROATE, CBMZ   .res Assessment: Plan:    Plan:  PDMP reviewed  D/C Adderall 20mg  daily Add Concerta  27mg  every morning  Luvox  100mg  daily Trazadone 100mg  at hs  Clonazepam  0.5mg  at hs for sleep - 10 tablets.  Check BP between visits  Discussed potential benefits, risks, and side effects of stimulants with patient to include increased heart rate, palpitations, insomnia, increased anxiety, increased irritability, or  decreased appetite.  Instructed patient to contact office if experiencing any significant tolerability issues.   RTC 4 weeks  Patient advised to contact office with any questions, adverse effects, or acute worsening in signs and symptoms.  There are no diagnoses linked to this encounter.   Please see After Visit Summary for patient specific instructions.  No future appointments.   No orders of the defined types were placed in this encounter.   -------------------------------

## 2023-03-09 DIAGNOSIS — F4323 Adjustment disorder with mixed anxiety and depressed mood: Secondary | ICD-10-CM | POA: Diagnosis not present

## 2023-03-23 DIAGNOSIS — Z01419 Encounter for gynecological examination (general) (routine) without abnormal findings: Secondary | ICD-10-CM | POA: Diagnosis not present

## 2023-03-23 DIAGNOSIS — Z6836 Body mass index (BMI) 36.0-36.9, adult: Secondary | ICD-10-CM | POA: Diagnosis not present

## 2023-03-23 DIAGNOSIS — Z113 Encounter for screening for infections with a predominantly sexual mode of transmission: Secondary | ICD-10-CM | POA: Diagnosis not present

## 2023-03-30 ENCOUNTER — Telehealth (INDEPENDENT_AMBULATORY_CARE_PROVIDER_SITE_OTHER): Payer: BC Managed Care – PPO | Admitting: Adult Health

## 2023-03-30 ENCOUNTER — Encounter: Payer: Self-pay | Admitting: Adult Health

## 2023-03-30 DIAGNOSIS — F909 Attention-deficit hyperactivity disorder, unspecified type: Secondary | ICD-10-CM

## 2023-03-30 DIAGNOSIS — F900 Attention-deficit hyperactivity disorder, predominantly inattentive type: Secondary | ICD-10-CM

## 2023-03-30 DIAGNOSIS — F4312 Post-traumatic stress disorder, chronic: Secondary | ICD-10-CM

## 2023-03-30 DIAGNOSIS — F428 Other obsessive-compulsive disorder: Secondary | ICD-10-CM | POA: Diagnosis not present

## 2023-03-30 DIAGNOSIS — F331 Major depressive disorder, recurrent, moderate: Secondary | ICD-10-CM

## 2023-03-30 MED ORDER — LISDEXAMFETAMINE DIMESYLATE 20 MG PO CAPS: 20.0000 mg | ORAL_CAPSULE | Freq: Every day | ORAL | 0 refills | Status: DC

## 2023-03-30 NOTE — Progress Notes (Signed)
Tracey Gibson [id number] 05-29-1990 32 y.o.  Virtual Visit via Video Note  I connected with pt @ on 03/30/23 at  4:30 PM EST by a video enabled telemedicine application and verified that I am speaking with the correct person using two identifiers.   I discussed the limitations of evaluation and management by telemedicine and the availability of in person appointments. The patient expressed understanding and agreed to proceed.  I discussed the assessment and treatment plan with the patient. The patient was provided an opportunity to ask questions and all were answered. The patient agreed with the plan and demonstrated an understanding of the instructions.   The patient was advised to call back or seek an in-person evaluation if the symptoms worsen or if the condition fails to improve as anticipated.  I provided 25 minutes of non-face-to-face time during this encounter.  The patient was located at home.  The provider was located at [facility].   Dorothyann Gibbs, NP   Subjective:   Patient ID:  Tracey Gibson is a 33 y.o. (DOB 04-13-1990) female.  Chief Complaint: No chief complaint on file.   HPI Brynlynn ELIANNA WINDOM presents for follow-up of ADHD, OCD, PTSD, and MDD.  Describes mood today as "ok". Pleasant. Mood symptoms - denies depression, anxiety, and irritability. Decreased interest and motivation. Denies panic attacks. Denies worry, rumination, and over thinking. Denies obsessive thoughts and acts. Mood is stable. Stating "I feel like I'm doing ok".   Taking medications as prescribed.  Energy levels improved. Active, does not have a regular exercise routine.  Enjoys some usual interests and activities. Dating - has a boyfriend - moving in together in February. Currently, lives alone in an apartment with cat and a dog. Father local. Mother recently moved to [facility]. Spending time with family. Appetite adequate. Weight stable. Sleep has improved. Averages 8 hours a night. Reports  focus and concentration difficulties. Would like to do a trial of Vyvanse - took for several years. Completing tasks. Managing aspects of household. 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  Denies AH or VH. Denies self harm. Denies substance use. Reports decreased alcohol use.  Review of Systems:  Review of Systems  Musculoskeletal:  Negative for gait problem.  Neurological:  Negative for tremors.  Psychiatric/Behavioral:         Please refer to HPI    Medications: I have reviewed the patient's current medications.  Current Outpatient Medications  Medication Sig Dispense Refill   clonazePAM (KLONOPIN) 0.5 MG tablet Take 1 tablet (0.5 mg total) by mouth at bedtime as needed for anxiety. 10 tablet 0   fluvoxaMINE (LUVOX) 100 MG tablet Take 1 tablet (100 mg total) by mouth at bedtime. 90 tablet 3   methylphenidate (CONCERTA) 27 MG PO CR tablet Take 1 tablet (27 mg total) by mouth every morning. 30 tablet 0   PARAGARD INTRAUTERINE COPPER IU by Intrauterine route. (Patient not taking: Reported on 04/27/2022)     traZODone (DESYREL) 100 MG tablet Take 1 tablet by mouth nightly at bedtime as needed for sleep. 90 tablet 3   No current facility-administered medications for this visit.    Medication Side Effects: None  Allergies: No Known Allergies  Past Medical History:  Diagnosis Date   ADHD    Anxiety    Depression     Family History  Problem Relation Age of Onset   Alcoholism Maternal Grandmother    Suicidality Maternal Grandfather    ADD / ADHD  Maternal Aunt    Bipolar disorder Maternal Aunt    Alcohol abuse Maternal Aunt     Social History   Socioeconomic History   Marital status: Single    Spouse name: Not on file   Number of children: Not on file   Years of education: Not on file   Highest education level: Not on file  Occupational History   Not on file  Tobacco Use   Smoking status: Never   Smokeless tobacco: Never  Vaping Use   Vaping status:  Former  Substance and Sexual Activity   Alcohol use: Yes   Drug use: Not Currently    Types: Marijuana   Sexual activity: Yes    Birth control/protection: I.U.D.  Other Topics Concern   Not on file  Social History Narrative   Not on file   Social Drivers of Health   Financial Resource Strain: Medium Risk (09/21/2021)   [facility] to [facility]   Overall Financial Resource Strain (CARDIA)    Difficulty of Paying Living Expenses: Somewhat hard  Food Insecurity: No Food Insecurity (09/21/2021)   [facility] to [facility] visits prior to [facility]01/[facility]22[facility]   Hunger Vital Sign    Worried About Running Out of Food in the Last Year: Never true    Ran Out of Food in the Last Year: Never true  Transportation Needs: No Transportation Needs (09/21/2021)   [facility] to [facility] visits prior to 05/01/2022[facility]   PRAPARE - Transportation    Lack of Transportation (Medical): No    Lack of Transportation (Non-Medical): No  Physical Activity: Unknown (09/21/2021)   [facility] to [facility] visits prior to 05/01/2022[facility]   Exercise Vital Sign    Days of Exercise per Week: 0 days    Minutes of Exercise per Session: Patient declined  Stress: No Stress Concern Present (09/21/2021)   [facility] prior to [facility]01/2022[facility] visits prior to 05[facility]01/2022[facility]   [employer] of Occupational Health - Occupational Stress Questionnaire    Feeling of Stress : Not at  all  Social Connections: Socially Isolated (09/21/2021)   [facility] prior to [facility]01[facility] visits prior to 05/01/2022[facility]   Social Connection and Isolation Panel [NHANES]    Frequency of Communication with Friends and Family: More than three times a week    Frequency of Social Gatherings with Friends and Family: Twice a week    Attends Religious Services: Never    Database administrator or Organizations: No    Attends Engineer, structural: Never    Marital Status: Never married  Catering manager Violence: Not on file    Past Medical History, Surgical history, Social history, and Family history were reviewed and updated as appropriate.   Please see review of systems for further details on the patient's review from today.   Objective:   Physical Exam:  There were no vitals taken for this visit.  Physical Exam Constitutional:  General: She is not in acute distress. Musculoskeletal:        General: No deformity.  Neurological:     Mental Status: She is alert and oriented to person, place, and time.     Coordination: Coordination normal.  Psychiatric:        Attention and Perception: Attention and perception normal. She does not perceive auditory or visual hallucinations.        Mood and Affect: Mood normal. Mood is not anxious or depressed. Affect is not labile, blunt, angry or inappropriate.        Speech: Speech normal.        Behavior: Behavior normal.        Thought Content: Thought content normal. Thought content is not paranoid or delusional. Thought content does not include homicidal or suicidal ideation. Thought content does not include homicidal or suicidal plan.        Cognition and Memory: Cognition and memory normal.        Judgment: Judgment normal.     Comments: Insight intact     Lab Review:     Component Value Date/Time   NA 135 12/05/2019 1427    NA 137 08/19/2016 0934   K 3.5 12/05/2019 1427   CL 100 12/05/2019 1427   CO2 26 12/05/2019 1427   GLUCOSE 87 12/05/2019 1427   BUN 10 12/05/2019 1427   BUN 7 08/19/2016 0934   CREATININE 0.79 12/05/2019 1427   CALCIUM 9.2 12/05/2019 1427   PROT 7.0 08/19/2016 0934   ALBUMIN 4.8 08/19/2016 0934   AST 16 12/05/2019 1427   ALT 16 12/05/2019 1427   ALKPHOS 35 (L) 08/19/2016 0934   BILITOT 0.3 08/19/2016 0934   GFRNONAA 116 08/19/2016 0934   GFRAA 133 08/19/2016 0934       Component Value Date/Time   WBC 7.2 12/05/2019 1427   RBC 4.24 12/05/2019 1427   HGB 13.5 12/05/2019 1427   HGB 12.5 08/19/2016 0934   HCT 39.7 12/05/2019 1427   HCT 37.7 08/19/2016 0934   PLT 344.0 12/05/2019 1427   PLT 253 08/19/2016 0934   MCV 93.6 12/05/2019 1427   MCV 92 08/19/2016 0934   MCH 30.6 08/19/2016 0934   MCHC 34.1 12/05/2019 1427   RDW 13.2 12/05/2019 1427   RDW 13.1 08/19/2016 0934   LYMPHSABS 1.9 08/19/2016 0934   EOSABS 0.4 08/19/2016 0934   BASOSABS 0.0 08/19/2016 0934    No results found for: "POCLITH", "LITHIUM"   No results found for: "PHENYTOIN", "PHENOBARB", "VALPROATE", "CBMZ"   .res Assessment: Plan:   Plan:  PDMP reviewed  D/C Adderall 20mg  daily D/C Concerta 27mg  every morning  Add Vyvanse 20mg  daily  Luvox 100mg  daily Trazadone 100mg  at hs  Clonazepam 0.5mg  at hs for sleep - 10 tablets.  Check BP between visits  25 minutes spent dedicated to the care of this patient on the date of this encounter to include pre-visit review of records, ordering of medication, post visit documentation, and face-to-face time with the patient discussing ADHD, OCD, PTSD, and MDD. Discussed starting Vyvanse to help manage current ADHD symptoms.  Discussed potential benefits, risks, and side effects of stimulants with patient to include increased heart rate, palpitations, insomnia, increased anxiety, increased irritability, or decreased appetite.  Instructed patient to contact  office if experiencing any significant tolerability issues.   RTC 4 weeks  Patient advised to contact office with any questions, adverse effects, or acute worsening in signs and symptoms. There are no diagnoses linked  to this encounter.   Please see After Visit Summary for patient specific instructions.  Future Appointments  Date Time Provider Department Center  03/30/2023  4:30 PM Vahan Wadsworth, Thereasa Solo, NP CP-CP None    No orders of the defined types were placed in this encounter.     -------------------------------

## 2023-04-06 DIAGNOSIS — F4323 Adjustment disorder with mixed anxiety and depressed mood: Secondary | ICD-10-CM | POA: Diagnosis not present

## 2023-05-17 DIAGNOSIS — S61452A Open bite of left hand, initial encounter: Secondary | ICD-10-CM | POA: Diagnosis not present

## 2023-05-21 ENCOUNTER — Other Ambulatory Visit: Payer: Self-pay | Admitting: Adult Health

## 2023-05-21 DIAGNOSIS — F4312 Post-traumatic stress disorder, chronic: Secondary | ICD-10-CM

## 2023-05-21 DIAGNOSIS — F341 Dysthymic disorder: Secondary | ICD-10-CM

## 2023-05-22 NOTE — Telephone Encounter (Signed)
Verify pharmacy 

## 2023-06-01 ENCOUNTER — Encounter: Payer: Self-pay | Admitting: Adult Health

## 2023-06-01 ENCOUNTER — Telehealth: Admitting: Adult Health

## 2023-06-01 DIAGNOSIS — F909 Attention-deficit hyperactivity disorder, unspecified type: Secondary | ICD-10-CM | POA: Diagnosis not present

## 2023-06-01 DIAGNOSIS — F4310 Post-traumatic stress disorder, unspecified: Secondary | ICD-10-CM

## 2023-06-01 DIAGNOSIS — F4312 Post-traumatic stress disorder, chronic: Secondary | ICD-10-CM

## 2023-06-01 DIAGNOSIS — F429 Obsessive-compulsive disorder, unspecified: Secondary | ICD-10-CM

## 2023-06-01 DIAGNOSIS — F428 Other obsessive-compulsive disorder: Secondary | ICD-10-CM

## 2023-06-01 DIAGNOSIS — F339 Major depressive disorder, recurrent, unspecified: Secondary | ICD-10-CM

## 2023-06-01 DIAGNOSIS — F4323 Adjustment disorder with mixed anxiety and depressed mood: Secondary | ICD-10-CM | POA: Diagnosis not present

## 2023-06-01 DIAGNOSIS — F900 Attention-deficit hyperactivity disorder, predominantly inattentive type: Secondary | ICD-10-CM

## 2023-06-01 DIAGNOSIS — F331 Major depressive disorder, recurrent, moderate: Secondary | ICD-10-CM

## 2023-06-01 MED ORDER — LISDEXAMFETAMINE DIMESYLATE 30 MG PO CAPS: 30.0000 mg | ORAL_CAPSULE | Freq: Every day | ORAL | 0 refills | Status: DC

## 2023-06-01 NOTE — Progress Notes (Signed)
 Tracey Gibson [id number] 12-27-1990 33 y.o.  Virtual Visit via Video Note  I connected with pt @ on 06/01/23 at  9:00 AM EDT by a video enabled telemedicine application and verified that I am speaking with the correct person using two identifiers.   I discussed the limitations of evaluation and management by telemedicine and the availability of in person appointments. The patient expressed understanding and agreed to proceed.  I discussed the assessment and treatment plan with the patient. The patient was provided an opportunity to ask questions and all were answered. The patient agreed with the plan and demonstrated an understanding of the instructions.   The patient was advised to call back or seek an in-person evaluation if the symptoms worsen or if the condition fails to improve as anticipated.  I provided 25 minutes of non-face-to-face time during this encounter.  The patient was located at home.  The provider was located at [facility].   Dorothyann Gibbs, NP   Subjective:   Patient ID:  Tracey Gibson is a 33 y.o. (DOB 27-Feb-1991) female.  Chief Complaint: No chief complaint on file.   HPI Tracey Gibson presents for follow-up of ADHD, OCD, PTSD, and MDD.  Describes mood today as "ok". Pleasant. Mood symptoms - denies depression, anxiety, and irritability. Improved interest and motivation. Denies panic attacks. Denies worry, rumination and over thinking. Denies obsessive thoughts and acts. Mood is stable. Stating "I feel like I'm doing really well".   Taking medications as prescribed.  Energy levels improved. Active, does not have a regular exercise routine.  Enjoys some usual interests and activities. Lives with boyfriend - cat and a dog. Parents local. Spending time with family. Appetite adequate. Weight loss.. Sleeps well most nights. Averages 8 hours a night. Reports focus and concentration improved. Feels like the addition of Vyvanse has been helpful. Completing  tasks. Managing aspects of household. 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  Denies AH or VH. Denies self harm. Denies substance use. Reports decreased alcohol use.   Review of Systems:  Review of Systems  Musculoskeletal:  Negative for gait problem.  Neurological:  Negative for tremors.  Psychiatric/Behavioral:         Please refer to HPI    Medications: I have reviewed the patient's current medications.  Current Outpatient Medications  Medication Sig Dispense Refill   clonazePAM (KLONOPIN) 0.5 MG tablet Take 1 tablet (0.5 mg total) by mouth at bedtime as needed for anxiety. 10 tablet 0   fluvoxaMINE (LUVOX) 100 MG tablet Take 1 tablet (100 mg total) by mouth at bedtime. 90 tablet 3   lisdexamfetamine (VYVANSE) 20 MG capsule Take 1 capsule (20 mg total) by mouth daily. 30 capsule 0   PARAGARD INTRAUTERINE COPPER IU by Intrauterine route. (Patient not taking: Reported on 04/27/2022)     traZODone (DESYREL) 100 MG tablet Take 1 tablet by mouth nightly at bedtime as needed for sleep. 90 tablet 0   No current facility-administered medications for this visit.    Medication Side Effects: None  Allergies: No Known Allergies  Past Medical History:  Diagnosis Date   ADHD    Anxiety    Depression     Family History  Problem Relation Age of Onset   Alcoholism Maternal Grandmother    Suicidality Maternal Grandfather    ADD / ADHD Maternal Aunt    Bipolar disorder Maternal Aunt    Alcohol abuse Maternal Aunt     Social History  Socioeconomic History   Marital status: Single    Spouse name: Not on file   Number of children: Not on file   Years of education: Not on file   Highest education level: Not on file  Occupational History   Not on file  Tobacco Use   Smoking status: Never   Smokeless tobacco: Never  Vaping Use   Vaping status: Former  Substance and Sexual Activity   Alcohol use: Yes   Drug use: Not Currently    Types: Marijuana   Sexual  activity: Yes    Birth control/protection: I.U.D.  Other Topics Concern   Not on file  Social History Narrative   Not on file   Social Drivers of Health   Financial Resource Strain: Medium Risk (09/21/2021)   [facility] to [facility] visits prior to 05/01/2022[facility]   Overall Financial Resource Strain (CARDIA)    Difficulty of Paying Living Expenses: Somewhat hard  Food Insecurity: No Food Insecurity (09/21/2021)   [facility] prior to 05/01/2022[facility] visits prior to 05/01/2022., [facility]   Hunger Vital Sign    Worried About Running Out of Food in the Last Year: Never true    Ran Out of Food in the Last Year: Never true  Transportation Needs: No Transportation Needs (09/21/2021)   [facility] prior to 05[facility]01/[facility] visits prior to 05/01/2022., [facility]   PRAPARE - Transportation    Lack of Transportation (Medical): No    Lack of Transportation (Non-Medical): No  Physical Activity: Unknown (09/21/2021)   [facility] prior to 05/01/2022[facility] visits prior to 05/01/2022., [facility]   Exercise Vital Sign    Days of Exercise per Week: 0 days    Minutes of Exercise per Session: Patient declined  Stress: No Stress Concern Present (09/21/2021)   [facility] visits prior to 05/01/2022[facility] visits prior to 05/01/2022[facility]   [employer] of Occupational Health - Occupational Stress Questionnaire    Feeling of Stress : Not at all  Social Connections: Socially Isolated (09/21/2021)   [facility] visits  prior to 05/01/2022[facility] visits prior to 05/01/2022., [facility]   Social Connection and Isolation Panel [NHANES]    Frequency of Communication with Friends and Family: More than three times a week    Frequency of Social Gatherings with Friends and Family: Twice a week    Attends Religious Services: Never    Database administrator or Organizations: No    Attends Engineer, structural: Never    Marital Status: Never married  Catering manager Violence: Not on file    Past Medical History, Surgical history, Social history, and Family history were reviewed and updated as appropriate.   Please see review of systems for further details on the patient's review from today.   Objective:   Physical Exam:  There were no vitals taken for this visit.  Physical Exam Constitutional:      General: She is not in acute distress. Musculoskeletal:        General: No deformity.  Neurological:  Mental Status: She is alert and oriented to person, place, and time.     Coordination: Coordination normal.  Psychiatric:        Attention and Perception: Attention and perception normal. She does not perceive auditory or visual hallucinations.        Mood and Affect: Mood normal. Affect is not labile, blunt, angry or inappropriate.        Speech: Speech normal.        Behavior: Behavior normal.        Thought Content: Thought content normal. Thought content is not paranoid or delusional. Thought content does not include homicidal or suicidal ideation. Thought content does not include homicidal or suicidal plan.        Cognition and Memory: Cognition and memory normal.        Judgment: Judgment normal.     Comments: Insight intact     Lab Review:     Component Value Date/Time   NA 135 12/05/2019 1427   NA 137 08/19/2016 0934   K 3.5 12/05/2019 1427   CL 100 12/05/2019 1427   CO2 26 12/05/2019 1427   GLUCOSE 87 12/05/2019 1427   BUN 10  12/05/2019 1427   BUN 7 08/19/2016 0934   CREATININE 0.79 12/05/2019 1427   CALCIUM 9.2 12/05/2019 1427   PROT 7.0 08/19/2016 0934   ALBUMIN 4.8 08/19/2016 0934   AST 16 12/05/2019 1427   ALT 16 12/05/2019 1427   ALKPHOS 35 (L) 08/19/2016 0934   BILITOT 0.3 08/19/2016 0934   GFRNONAA 116 08/19/2016 0934   GFRAA 133 08/19/2016 0934       Component Value Date/Time   WBC 7.2 12/05/2019 1427   RBC 4.24 12/05/2019 1427   HGB 13.5 12/05/2019 1427   HGB 12.5 08/19/2016 0934   HCT 39.7 12/05/2019 1427   HCT 37.7 08/19/2016 0934   PLT 344.0 12/05/2019 1427   PLT 253 08/19/2016 0934   MCV 93.6 12/05/2019 1427   MCV 92 08/19/2016 0934   MCH 30.6 08/19/2016 0934   MCHC 34.1 12/05/2019 1427   RDW 13.2 12/05/2019 1427   RDW 13.1 08/19/2016 0934   LYMPHSABS 1.9 08/19/2016 0934   EOSABS 0.4 08/19/2016 0934   BASOSABS 0.0 08/19/2016 0934    No results found for: "POCLITH", "LITHIUM"   No results found for: "PHENYTOIN", "PHENOBARB", "VALPROATE", "CBMZ"   .res Assessment: Plan:    Plan:  PDMP reviewed  3 months  Increase Vyvanse 20mg  to 30mg  daily  Luvox 100mg  daily Trazadone 100mg  at hs  Clonazepam 0.5mg  at hs for sleep - 10 tablets.  Check BP between visits  25 minutes spent dedicated to the care of this patient on the date of this encounter to include pre-visit review of records, ordering of medication, post visit documentation, and face-to-face time with the patient discussing ADHD, OCD, PTSD, and MDD. Discussed increasing Vyvanse from 20mg  to 30mg  to help manage current ADHD symptoms.  Discussed potential benefits, risks, and side effects of stimulants with patient to include increased heart rate, palpitations, insomnia, increased anxiety, increased irritability, or decreased appetite.  Instructed patient to contact office if experiencing any significant tolerability issues.   RTC 4 weeks  Patient advised to contact office with any questions, adverse effects, or acute  worsening in signs and symptoms. Diagnoses and all orders for this visit:  Attention deficit hyperactivity disorder (ADHD), inattentive type, moderate     Please see After Visit Summary for patient specific instructions.  Future Appointments  Date Time  Provider Department Center  09/01/2023  8:30 AM Kieren Ricci, Thereasa Solo, NP CP-CP None    No orders of the defined types were placed in this encounter.     -------------------------------

## 2023-06-08 DIAGNOSIS — Z3202 Encounter for pregnancy test, result negative: Secondary | ICD-10-CM | POA: Diagnosis not present

## 2023-06-08 DIAGNOSIS — Z30431 Encounter for routine checking of intrauterine contraceptive device: Secondary | ICD-10-CM | POA: Diagnosis not present

## 2023-06-08 DIAGNOSIS — R350 Frequency of micturition: Secondary | ICD-10-CM | POA: Diagnosis not present

## 2023-06-08 DIAGNOSIS — N760 Acute vaginitis: Secondary | ICD-10-CM | POA: Diagnosis not present

## 2023-07-20 DIAGNOSIS — F4323 Adjustment disorder with mixed anxiety and depressed mood: Secondary | ICD-10-CM | POA: Diagnosis not present

## 2023-07-30 ENCOUNTER — Other Ambulatory Visit: Payer: Self-pay | Admitting: Adult Health

## 2023-07-30 DIAGNOSIS — F4312 Post-traumatic stress disorder, chronic: Secondary | ICD-10-CM

## 2023-07-30 DIAGNOSIS — F341 Dysthymic disorder: Secondary | ICD-10-CM

## 2023-09-01 ENCOUNTER — Encounter: Payer: Self-pay | Admitting: Adult Health

## 2023-09-01 ENCOUNTER — Telehealth: Admitting: Adult Health

## 2023-09-01 DIAGNOSIS — F900 Attention-deficit hyperactivity disorder, predominantly inattentive type: Secondary | ICD-10-CM | POA: Diagnosis not present

## 2023-09-01 DIAGNOSIS — F331 Major depressive disorder, recurrent, moderate: Secondary | ICD-10-CM | POA: Diagnosis not present

## 2023-09-01 DIAGNOSIS — F428 Other obsessive-compulsive disorder: Secondary | ICD-10-CM

## 2023-09-01 DIAGNOSIS — F4312 Post-traumatic stress disorder, chronic: Secondary | ICD-10-CM | POA: Diagnosis not present

## 2023-09-01 MED ORDER — LISDEXAMFETAMINE DIMESYLATE 30 MG PO CAPS: 30.0000 mg | ORAL_CAPSULE | Freq: Every day | ORAL | 0 refills | Status: AC

## 2023-09-01 MED ORDER — LISDEXAMFETAMINE DIMESYLATE 30 MG PO CAPS
30.0000 mg | ORAL_CAPSULE | Freq: Every day | ORAL | 0 refills | Status: AC
Start: 2023-09-29 — End: ?

## 2023-09-01 MED ORDER — TRAZODONE HCL 100 MG PO TABS
ORAL_TABLET | ORAL | 3 refills | Status: AC
Start: 2023-09-01 — End: ?

## 2023-09-01 MED ORDER — FLUVOXAMINE MALEATE 100 MG PO TABS: 100.0000 mg | ORAL_TABLET | Freq: Every day | ORAL | 3 refills | Status: AC

## 2023-09-01 NOTE — Progress Notes (Signed)
 Tracey Gibson [id number] 1990-06-21 33 y.o.  Virtual Visit via Video Note  I connected with pt @ on 09/01/23 at  8:30 AM EDT by a video enabled telemedicine application and verified that I am speaking with the correct person using two identifiers.   I discussed the limitations of evaluation and management by telemedicine and the availability of in person appointments. The patient expressed understanding and agreed to proceed.  I discussed the assessment and treatment plan with the patient. The patient was provided an opportunity to ask questions and all were answered. The patient agreed with the plan and demonstrated an understanding of the instructions.   The patient was advised to call back or seek an in-person evaluation if the symptoms worsen or if the condition fails to improve as anticipated.  I provided 25 minutes of non-face-to-face time during this encounter.  The patient was located at home.  The provider was located at [facility].   Angeline LOISE Sayers, NP   Subjective:   Patient ID:  Tracey Gibson is a 33 y.o. (DOB Jun 20, 1990) female.  Chief Complaint: No chief complaint on file.   HPI Tracey Gibson presents for follow-up of ADHD, OCD, PTSD, and MDD.  Describes mood today as ok. Pleasant. Mood symptoms - denies depression, anxiety and irritability. Reports stable interest and motivation. Denies panic attacks. Denies worry, rumination and over thinking. Denies obsessive thoughts and acts. Reports mood is stable. Stating I feel like I'm doing good.   Taking medications as prescribed.  Energy levels stable. Active, does not have a regular exercise routine.  Enjoys some usual interests and activities. Lives with boyfriend - cat and a dog. Parents local. Spending time with family. Appetite adequate. Weight loss. Sleeps well most nights. Averages 8 hours a night. Reports focus and concentration stable. Feels like the addition of Vyvanse  has been helpful. Completing  tasks. Managing aspects of household. 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  Denies AH or VH. Denies self harm. Denies substance use. Reports social alcohol use.   Review of Systems:  Review of Systems  Musculoskeletal:  Negative for gait problem.  Neurological:  Negative for tremors.  Psychiatric/Behavioral:         Please refer to HPI    Medications: I have reviewed the patient's current medications.  Current Outpatient Medications  Medication Sig Dispense Refill   clonazePAM  (KLONOPIN ) 0.5 MG tablet Take 1 tablet (0.5 mg total) by mouth at bedtime as needed for anxiety. 10 tablet 0   fluvoxaMINE  (LUVOX ) 100 MG tablet Take 1 tablet (100 mg total) by mouth at bedtime. 90 tablet 3   lisdexamfetamine (VYVANSE ) 30 MG capsule Take 1 capsule (30 mg total) by mouth daily. 30 capsule 0   lisdexamfetamine (VYVANSE ) 30 MG capsule Take 1 capsule (30 mg total) by mouth daily. 30 capsule 0   lisdexamfetamine (VYVANSE ) 30 MG capsule Take 1 capsule (30 mg total) by mouth daily. 30 capsule 0   PARAGARD  INTRAUTERINE COPPER  IU by Intrauterine route. (Patient not taking: Reported on 04/27/2022)     traZODone  (DESYREL ) 100 MG tablet Take 1 tablet by mouth nightly at bedtime as needed for sleep. 90 tablet 0   No current facility-administered medications for this visit.    Medication Side Effects: None  Allergies: No Known Allergies  Past Medical History:  Diagnosis Date   ADHD    Anxiety    Depression     Family History  Problem Relation Age of Onset  Alcoholism Maternal Grandmother    Suicidality Maternal Grandfather    ADD / ADHD Maternal Aunt    Bipolar disorder Maternal Aunt    Alcohol abuse Maternal Aunt     Social History   Socioeconomic History   Marital status: Single    Spouse name: Not on file   Number of children: Not on file   Years of education: Not on file   Highest education level: Not on file  Occupational History   Not on file  Tobacco Use    Smoking status: Never   Smokeless tobacco: Never  Vaping Use   Vaping status: Former  Substance and Sexual Activity   Alcohol use: Yes   Drug use: Not Currently    Types: Marijuana   Sexual activity: Yes    Birth control/protection: I.U.D.  Other Topics Concern   Not on file  Social History Narrative   Not on file   Social Drivers of Health   Financial Resource Strain: Medium Risk (09/21/2021)   Received from [facility] prior to [facility]01/2022[facility]   Overall Financial Resource Strain (CARDIA)    Difficulty of Paying Living Expenses: Somewhat hard  Food Insecurity: No Food Insecurity (09/21/2021)   Received from [facility] visits prior to 05/01/2022., [facility]   Hunger Vital Sign    Worried About Running Out of Food in the Last Year: Never true    Ran Out of Food in the Last Year: Never true  Transportation Needs: No Transportation Needs (09/21/2021)   Received [facility] visits prior to 05/01/2022., [facility]   PRAPARE - Transportation    Lack of Transportation (Medical): No    Lack of Transportation (Non-Medical): No  Physical Activity: Unknown (09/21/2021)   Received from [facility] visits prior to 05/01/2022., [facility]   Exercise Vital Sign    On average, how many days per week do you engage in moderate to strenuous exercise (like a brisk walk)?: 0 days    On average, how many minutes do you engage in exercise at this level?: Patient declined  Stress: No Stress Concern Present (09/21/2021)   [facility] visits prior to [facility]01/2022., [facility]   [employer] of Occupational Health - Occupational Stress Questionnaire    Feeling of Stress : Not at all  Social Connections: Socially Isolated (09/21/2021)   [facility] prior to [facility]01/2022[facility]   Social Connection and Isolation  Panel    In a typical week, how many times do you talk on the phone with family, friends, or neighbors?: More than three times a week    How often do you get together with friends or relatives?: Twice a week    How often do you attend church or religious services?: Never    Do you belong to any clubs or organizations such as church groups, unions, fraternal or athletic groups, or school groups?: No    How often do you attend meetings of the clubs or organizations you belong to?: Never    Are you married, widowed, divorced, separated, never married, or living with a partner?: Never married  Intimate Partner Violence: Not on file    Past Medical History, Surgical history, Social history, and Family history were reviewed and updated as appropriate.   Please see review of systems for further details on the patient's review from today.  Objective:   Physical Exam:  There were no vitals taken for this visit.  Physical Exam Constitutional:      General: She is not in acute distress. Musculoskeletal:        General: No deformity.  Neurological:     Mental Status: She is alert and oriented to person, place, and time.     Coordination: Coordination normal.  Psychiatric:        Attention and Perception: Attention and perception normal. She does not perceive auditory or visual hallucinations.        Mood and Affect: Mood normal. Mood is not anxious or depressed. Affect is not labile, blunt, angry or inappropriate.        Speech: Speech normal.        Behavior: Behavior normal.        Thought Content: Thought content normal. Thought content is not paranoid or delusional. Thought content does not include homicidal or suicidal ideation. Thought content does not include homicidal or suicidal plan.        Cognition and Memory: Cognition and memory normal.        Judgment: Judgment normal.     Comments: Insight intact     Lab Review:     Component Value Date/Time   NA 135 12/05/2019 1427    NA 137 08/19/2016 0934   K 3.5 12/05/2019 1427   CL 100 12/05/2019 1427   CO2 26 12/05/2019 1427   GLUCOSE 87 12/05/2019 1427   BUN 10 12/05/2019 1427   BUN 7 08/19/2016 0934   CREATININE 0.79 12/05/2019 1427   CALCIUM 9.2 12/05/2019 1427   PROT 7.0 08/19/2016 0934   ALBUMIN 4.8 08/19/2016 0934   AST 16 12/05/2019 1427   ALT 16 12/05/2019 1427   ALKPHOS 35 (L) 08/19/2016 0934   BILITOT 0.3 08/19/2016 0934   GFRNONAA 116 08/19/2016 0934   GFRAA 133 08/19/2016 0934       Component Value Date/Time   WBC 7.2 12/05/2019 1427   RBC 4.24 12/05/2019 1427   HGB 13.5 12/05/2019 1427   HGB 12.5 08/19/2016 0934   HCT 39.7 12/05/2019 1427   HCT 37.7 08/19/2016 0934   PLT 344.0 12/05/2019 1427   PLT 253 08/19/2016 0934   MCV 93.6 12/05/2019 1427   MCV 92 08/19/2016 0934   MCH 30.6 08/19/2016 0934   MCHC 34.1 12/05/2019 1427   RDW 13.2 12/05/2019 1427   RDW 13.1 08/19/2016 0934   LYMPHSABS 1.9 08/19/2016 0934   EOSABS 0.4 08/19/2016 0934   BASOSABS 0.0 08/19/2016 0934    No results found for: POCLITH, LITHIUM   No results found for: PHENYTOIN, PHENOBARB, VALPROATE, CBMZ   .res Assessment: Plan:    Plan:  PDMP reviewed  6 months  Vyvanse  30mg  daily - will call in 3 months for next set of refills.   Luvox  100mg  daily Trazadone 100mg  at hs  Check BP between visits  25 minutes spent dedicated to the care of this patient on the date of this encounter to include pre-visit review of records, ordering of medication, post visit documentation, and face-to-face time with the patient discussing ADHD, OCD, PTSD, and MDD. Discussed continuing current medication regimen.  Discussed potential benefits, risks, and side effects of stimulants with patient to include increased heart rate, palpitations, insomnia, increased anxiety, increased irritability, or decreased appetite.  Instructed patient to contact office if experiencing any significant tolerability issues.   RTC 6  months  Patient advised to contact office with any questions,  adverse effects, or acute worsening in signs and symptoms.  There are no diagnoses linked to this encounter.   Please see After Visit Summary for patient specific instructions.  Future Appointments  Date Time Provider Department Center  09/01/2023  8:30 AM Jasiya Markie Nattalie, NP [facility] None    No orders of the defined types were placed in this encounter.     -------------------------------

## 2023-12-05 DIAGNOSIS — R102 Pelvic and perineal pain unspecified side: Secondary | ICD-10-CM | POA: Diagnosis not present

## 2024-01-31 DIAGNOSIS — R030 Elevated blood-pressure reading, without diagnosis of hypertension: Secondary | ICD-10-CM | POA: Diagnosis not present

## 2024-01-31 DIAGNOSIS — Z0000 Encounter for general adult medical examination without abnormal findings: Secondary | ICD-10-CM | POA: Diagnosis not present

## 2024-01-31 DIAGNOSIS — Z79899 Other long term (current) drug therapy: Secondary | ICD-10-CM | POA: Diagnosis not present

## 2024-01-31 DIAGNOSIS — F909 Attention-deficit hyperactivity disorder, unspecified type: Secondary | ICD-10-CM | POA: Diagnosis not present

## 2024-01-31 DIAGNOSIS — E7800 Pure hypercholesterolemia, unspecified: Secondary | ICD-10-CM | POA: Diagnosis not present
